# Patient Record
Sex: Female | Born: 1975 | Race: White | Hispanic: No | Marital: Married | Smoking: Former smoker
Health system: Southern US, Community
[De-identification: ages and names within clinical notes are randomized; demographics above are authoritative.]

## PROBLEM LIST (undated history)

## (undated) DIAGNOSIS — C73 Malignant neoplasm of thyroid gland: Secondary | ICD-10-CM

## (undated) HISTORY — PX: BREAST EXCISIONAL BIOPSY: SUR124

## (undated) HISTORY — PX: THYROID SURGERY: SHX805

---

## 1998-03-01 ENCOUNTER — Emergency Department (HOSPITAL_COMMUNITY): Admission: EM | Admit: 1998-03-01 | Discharge: 1998-03-02 | Payer: Self-pay | Admitting: Emergency Medicine

## 1998-03-02 ENCOUNTER — Encounter: Payer: Self-pay | Admitting: Emergency Medicine

## 1998-03-05 ENCOUNTER — Ambulatory Visit (HOSPITAL_COMMUNITY): Admission: RE | Admit: 1998-03-05 | Discharge: 1998-03-05 | Payer: Self-pay | Admitting: Family Medicine

## 1998-03-05 ENCOUNTER — Encounter: Payer: Self-pay | Admitting: Family Medicine

## 1998-09-01 ENCOUNTER — Ambulatory Visit (HOSPITAL_BASED_OUTPATIENT_CLINIC_OR_DEPARTMENT_OTHER): Admission: RE | Admit: 1998-09-01 | Discharge: 1998-09-01 | Payer: Self-pay | Admitting: Otolaryngology

## 1999-11-28 ENCOUNTER — Other Ambulatory Visit: Admission: RE | Admit: 1999-11-28 | Discharge: 1999-11-28 | Payer: Self-pay | Admitting: *Deleted

## 2000-07-25 ENCOUNTER — Emergency Department (HOSPITAL_COMMUNITY): Admission: EM | Admit: 2000-07-25 | Discharge: 2000-07-26 | Payer: Self-pay | Admitting: Emergency Medicine

## 2001-07-11 ENCOUNTER — Ambulatory Visit (HOSPITAL_COMMUNITY): Admission: RE | Admit: 2001-07-11 | Discharge: 2001-07-11 | Payer: Self-pay | Admitting: General Surgery

## 2001-07-11 ENCOUNTER — Encounter (INDEPENDENT_AMBULATORY_CARE_PROVIDER_SITE_OTHER): Payer: Self-pay | Admitting: *Deleted

## 2001-08-27 ENCOUNTER — Other Ambulatory Visit: Admission: RE | Admit: 2001-08-27 | Discharge: 2001-08-27 | Payer: Self-pay | Admitting: Obstetrics and Gynecology

## 2001-12-25 ENCOUNTER — Encounter: Payer: Self-pay | Admitting: General Surgery

## 2001-12-25 ENCOUNTER — Encounter: Admission: RE | Admit: 2001-12-25 | Discharge: 2001-12-25 | Payer: Self-pay | Admitting: General Surgery

## 2001-12-25 ENCOUNTER — Encounter (INDEPENDENT_AMBULATORY_CARE_PROVIDER_SITE_OTHER): Payer: Self-pay | Admitting: Specialist

## 2002-01-14 ENCOUNTER — Ambulatory Visit (HOSPITAL_COMMUNITY): Admission: RE | Admit: 2002-01-14 | Discharge: 2002-01-14 | Payer: Self-pay | Admitting: General Surgery

## 2002-01-14 ENCOUNTER — Encounter (INDEPENDENT_AMBULATORY_CARE_PROVIDER_SITE_OTHER): Payer: Self-pay

## 2002-03-20 ENCOUNTER — Emergency Department (HOSPITAL_COMMUNITY): Admission: EM | Admit: 2002-03-20 | Discharge: 2002-03-20 | Payer: Self-pay | Admitting: Emergency Medicine

## 2002-09-11 ENCOUNTER — Other Ambulatory Visit: Admission: RE | Admit: 2002-09-11 | Discharge: 2002-09-11 | Payer: Self-pay | Admitting: Obstetrics and Gynecology

## 2003-09-22 ENCOUNTER — Encounter: Admission: RE | Admit: 2003-09-22 | Discharge: 2003-09-22 | Payer: Self-pay | Admitting: General Surgery

## 2006-01-28 ENCOUNTER — Emergency Department (HOSPITAL_COMMUNITY): Admission: EM | Admit: 2006-01-28 | Discharge: 2006-01-28 | Payer: Self-pay | Admitting: Emergency Medicine

## 2006-02-05 ENCOUNTER — Ambulatory Visit (HOSPITAL_COMMUNITY): Admission: RE | Admit: 2006-02-05 | Discharge: 2006-02-05 | Payer: Self-pay | Admitting: Urology

## 2006-03-02 ENCOUNTER — Ambulatory Visit (HOSPITAL_COMMUNITY): Admission: RE | Admit: 2006-03-02 | Discharge: 2006-03-02 | Payer: Self-pay | Admitting: Urology

## 2006-04-20 ENCOUNTER — Ambulatory Visit: Payer: Self-pay | Admitting: Critical Care Medicine

## 2007-11-12 ENCOUNTER — Encounter: Admission: RE | Admit: 2007-11-12 | Discharge: 2007-11-12 | Payer: Self-pay | Admitting: Obstetrics and Gynecology

## 2007-11-18 ENCOUNTER — Encounter: Admission: RE | Admit: 2007-11-18 | Discharge: 2007-11-18 | Payer: Self-pay | Admitting: Internal Medicine

## 2007-11-28 ENCOUNTER — Encounter (INDEPENDENT_AMBULATORY_CARE_PROVIDER_SITE_OTHER): Payer: Self-pay | Admitting: Interventional Radiology

## 2007-11-28 ENCOUNTER — Other Ambulatory Visit: Admission: RE | Admit: 2007-11-28 | Discharge: 2007-11-28 | Payer: Self-pay | Admitting: Interventional Radiology

## 2007-11-28 ENCOUNTER — Encounter: Admission: RE | Admit: 2007-11-28 | Discharge: 2007-11-28 | Payer: Self-pay | Admitting: Internal Medicine

## 2007-12-10 ENCOUNTER — Ambulatory Visit (HOSPITAL_COMMUNITY): Admission: RE | Admit: 2007-12-10 | Discharge: 2007-12-10 | Payer: Self-pay | Admitting: Internal Medicine

## 2008-01-24 ENCOUNTER — Encounter (INDEPENDENT_AMBULATORY_CARE_PROVIDER_SITE_OTHER): Payer: Self-pay | Admitting: Surgery

## 2008-01-24 ENCOUNTER — Ambulatory Visit (HOSPITAL_COMMUNITY): Admission: RE | Admit: 2008-01-24 | Discharge: 2008-01-25 | Payer: Self-pay | Admitting: Surgery

## 2008-06-02 ENCOUNTER — Encounter (HOSPITAL_COMMUNITY): Admission: RE | Admit: 2008-06-02 | Discharge: 2008-08-31 | Payer: Self-pay | Admitting: Internal Medicine

## 2008-06-04 ENCOUNTER — Encounter (HOSPITAL_COMMUNITY): Admission: RE | Admit: 2008-06-04 | Discharge: 2008-09-02 | Payer: Self-pay | Admitting: Internal Medicine

## 2008-10-21 ENCOUNTER — Encounter: Admission: RE | Admit: 2008-10-21 | Discharge: 2008-10-21 | Payer: Self-pay | Admitting: Internal Medicine

## 2008-11-05 ENCOUNTER — Encounter (INDEPENDENT_AMBULATORY_CARE_PROVIDER_SITE_OTHER): Payer: Self-pay | Admitting: Interventional Radiology

## 2008-11-05 ENCOUNTER — Ambulatory Visit (HOSPITAL_COMMUNITY): Admission: RE | Admit: 2008-11-05 | Discharge: 2008-11-05 | Payer: Self-pay | Admitting: Internal Medicine

## 2008-11-19 IMAGING — CR DG CHEST 2V
3 series · 3 of 3 positions shown · non-contrast
Comparison: Chest CT 03/02/2006.

CLINICAL DATA: 32-year-old female preoperative exam for
multinodular goiter.

CHEST - 2 VIEW

[w chest pa *]
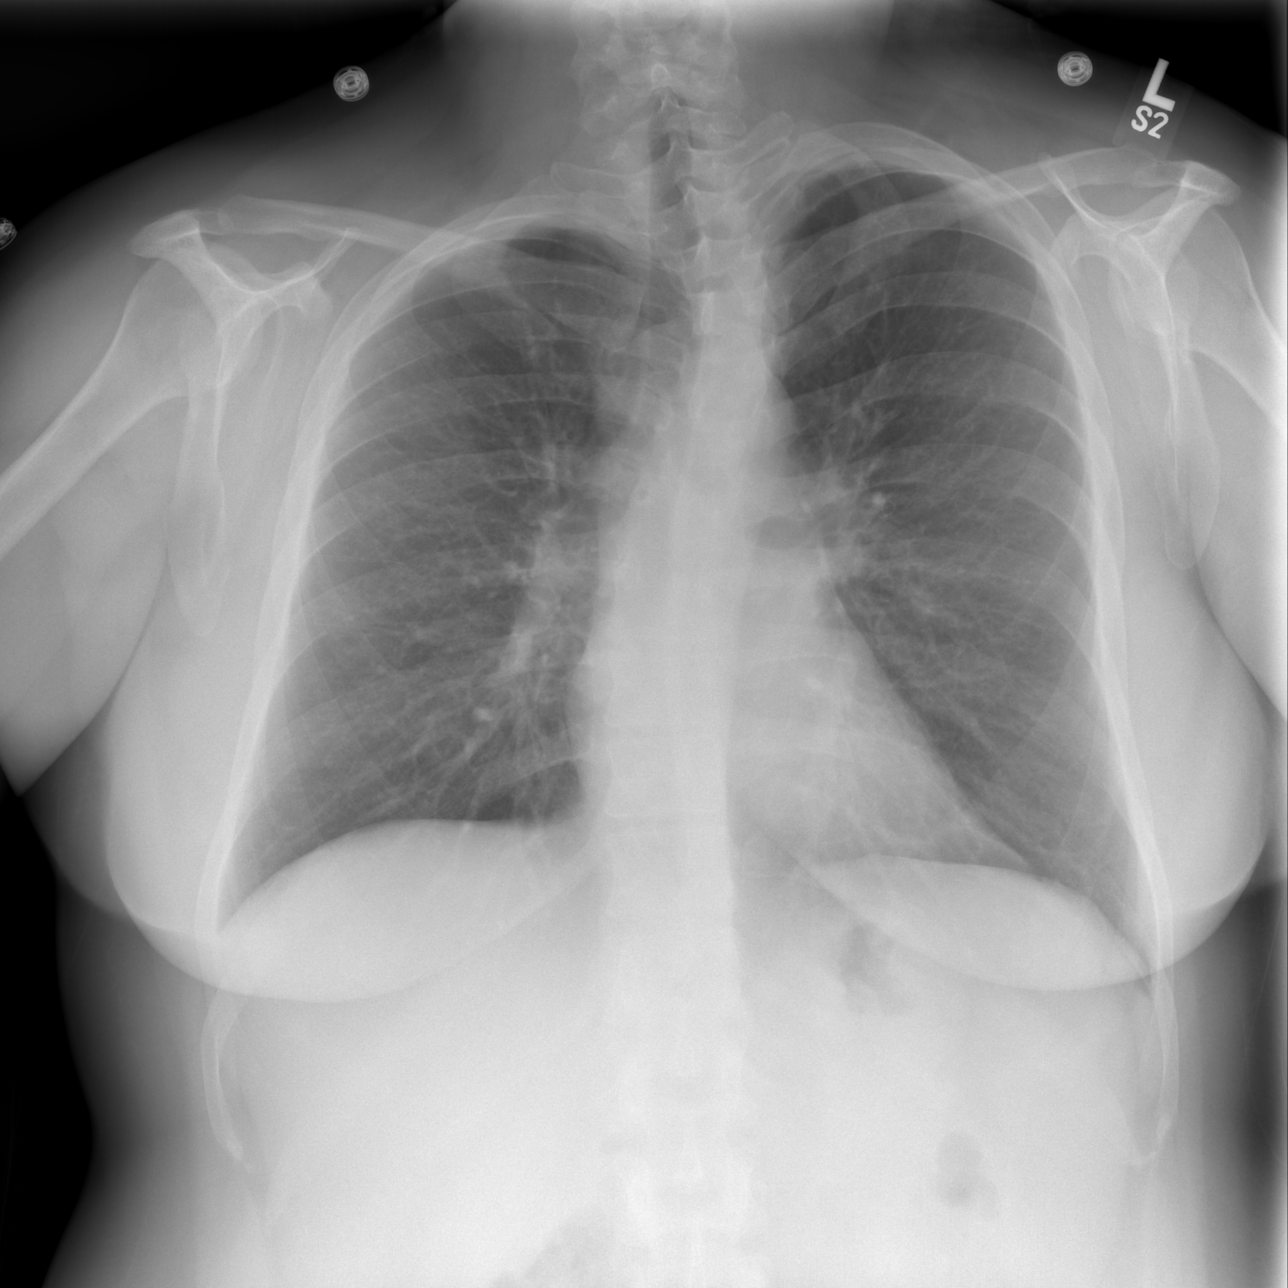

[w chest lat (1 of 2)]
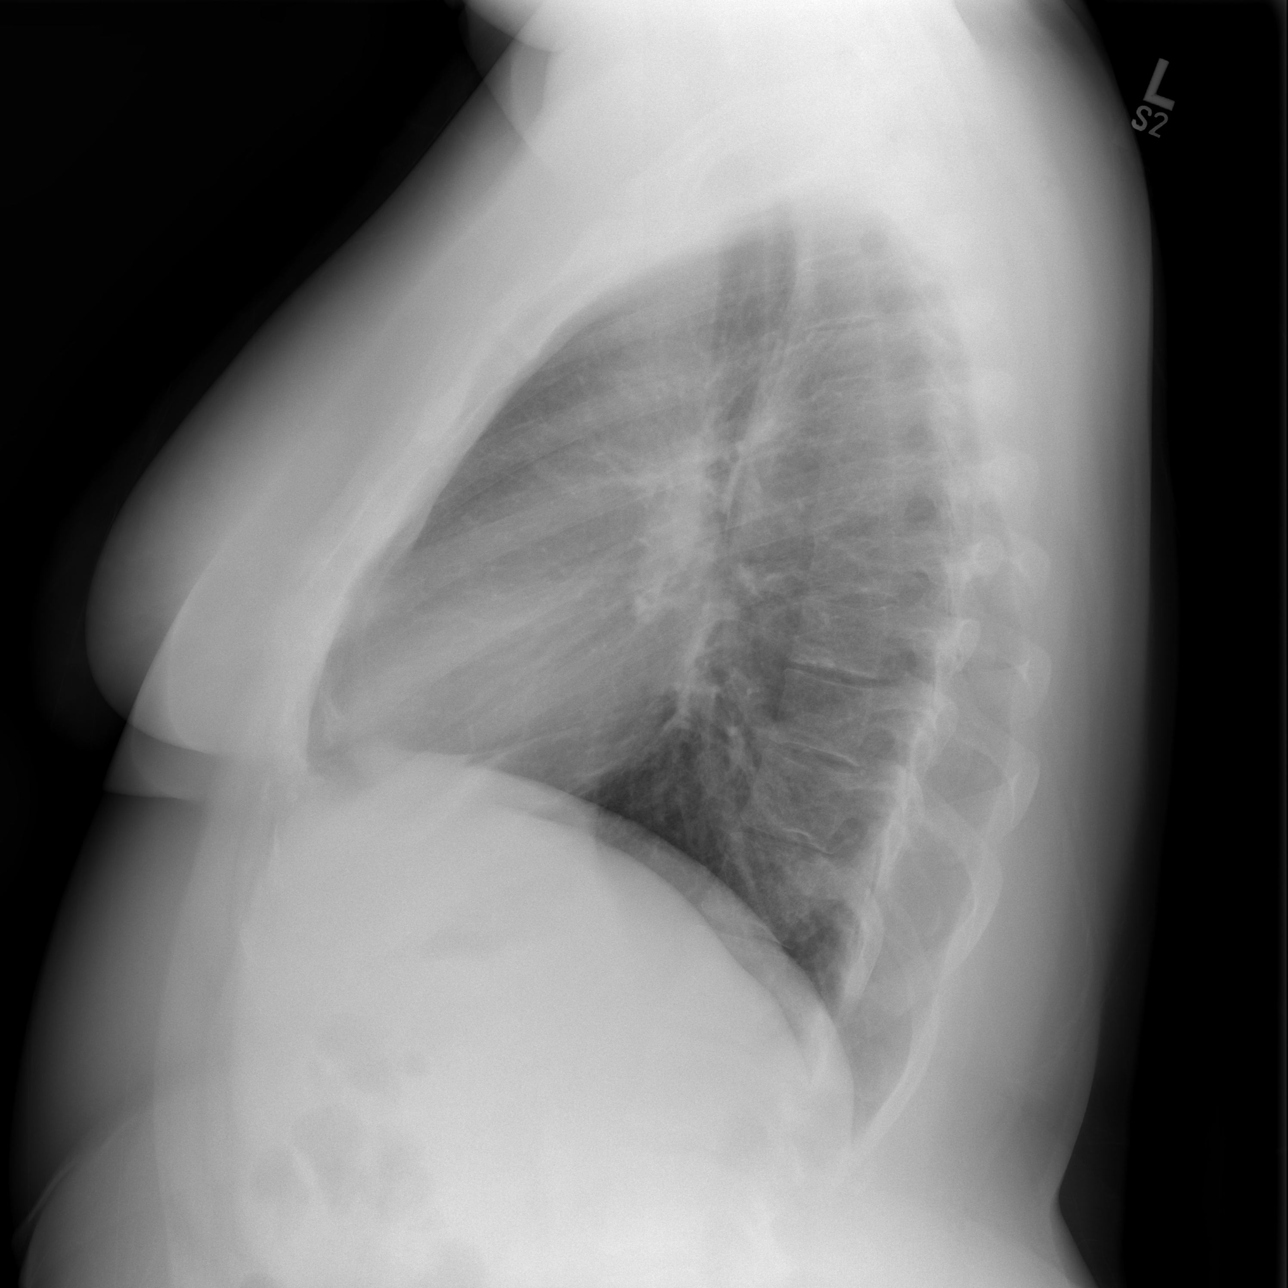

[w chest lat (2 of 2)]
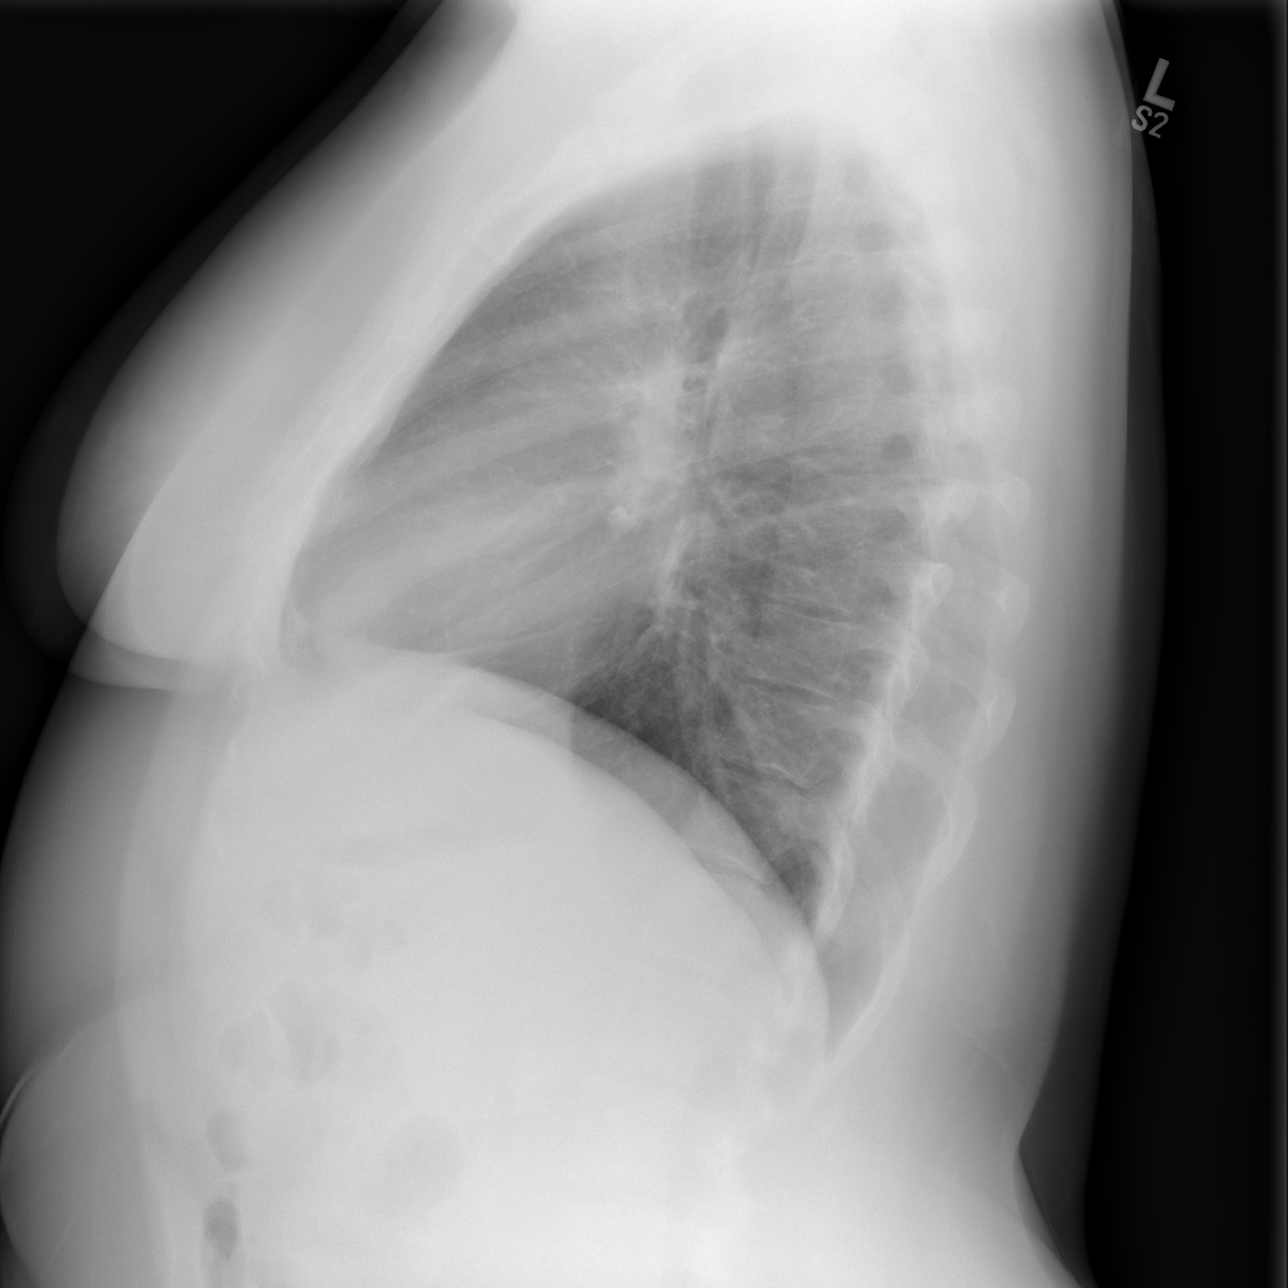

[3 of 3 positions shown; findings below may reference images not displayed]

FINDINGS: Stable upper thoracic levoconvex scoliosis. No acute
osseous abnormality identified.  Lung volumes within normal limits.
Normal cardiac size and mediastinal contours.  Tracheal air column
is within normal limits.  No tracheal deviation at the thoracic
inlet.  The lungs are clear.  No pleural effusion.  The small
pulmonary nodules described on the comparison CT would probably not
be visualized with plain radiographic technique.
IMPRESSION: No acute cardiopulmonary abnormality.

## 2008-11-20 ENCOUNTER — Encounter: Admission: RE | Admit: 2008-11-20 | Discharge: 2008-11-20 | Payer: Self-pay | Admitting: Obstetrics and Gynecology

## 2009-04-03 ENCOUNTER — Inpatient Hospital Stay (HOSPITAL_COMMUNITY): Admission: AD | Admit: 2009-04-03 | Discharge: 2009-04-03 | Payer: Self-pay | Admitting: Obstetrics and Gynecology

## 2009-04-04 ENCOUNTER — Ambulatory Visit: Payer: Self-pay | Admitting: Obstetrics and Gynecology

## 2009-04-04 ENCOUNTER — Inpatient Hospital Stay (HOSPITAL_COMMUNITY): Admission: AD | Admit: 2009-04-04 | Discharge: 2009-04-04 | Payer: Self-pay | Admitting: Obstetrics and Gynecology

## 2009-04-12 ENCOUNTER — Ambulatory Visit (HOSPITAL_COMMUNITY): Admission: RE | Admit: 2009-04-12 | Discharge: 2009-04-12 | Payer: Self-pay | Admitting: Internal Medicine

## 2009-04-14 ENCOUNTER — Encounter (HOSPITAL_COMMUNITY): Admission: RE | Admit: 2009-04-14 | Discharge: 2009-06-23 | Payer: Self-pay | Admitting: Internal Medicine

## 2009-09-01 IMAGING — US US BIOPSY
1 series · 5 of 5 positions shown · non-contrast
Comparison: none

CLINICAL DATA: Thyroid carcinoma status post thyroidectomy.
Recent ultrasound showed enlarged bilateral cervical lymph nodes.

[Series 1: us biopsy · 0.07mm/px · 5 of 5 slices shown]
[im 1/5]
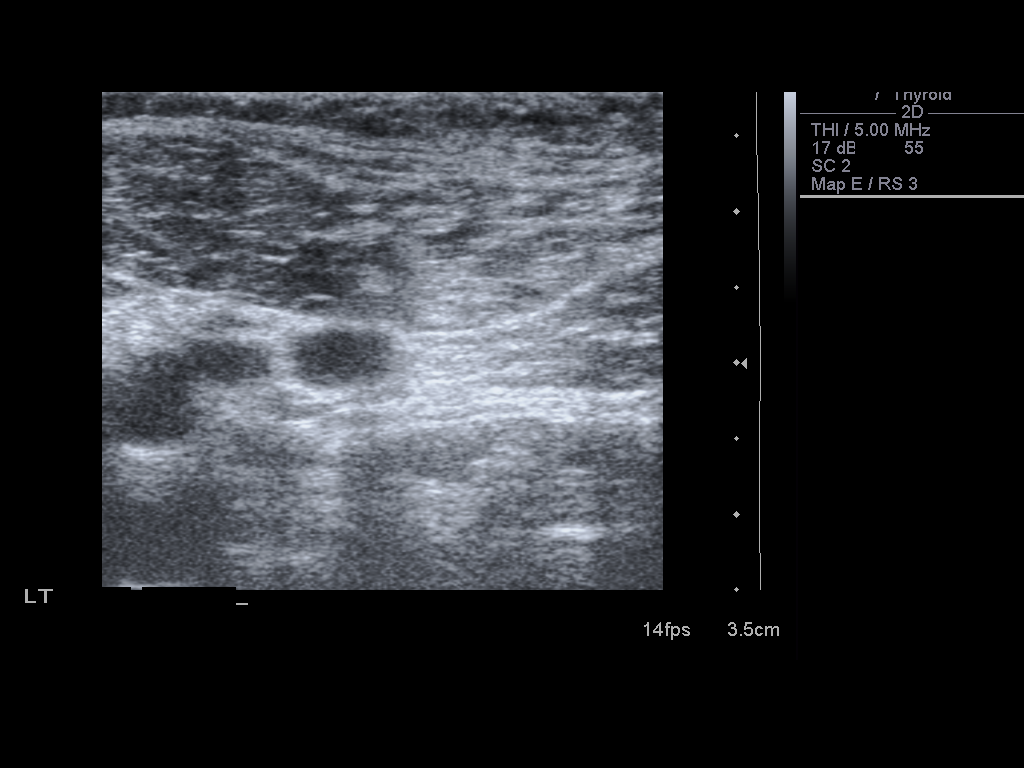
[im 2/5]
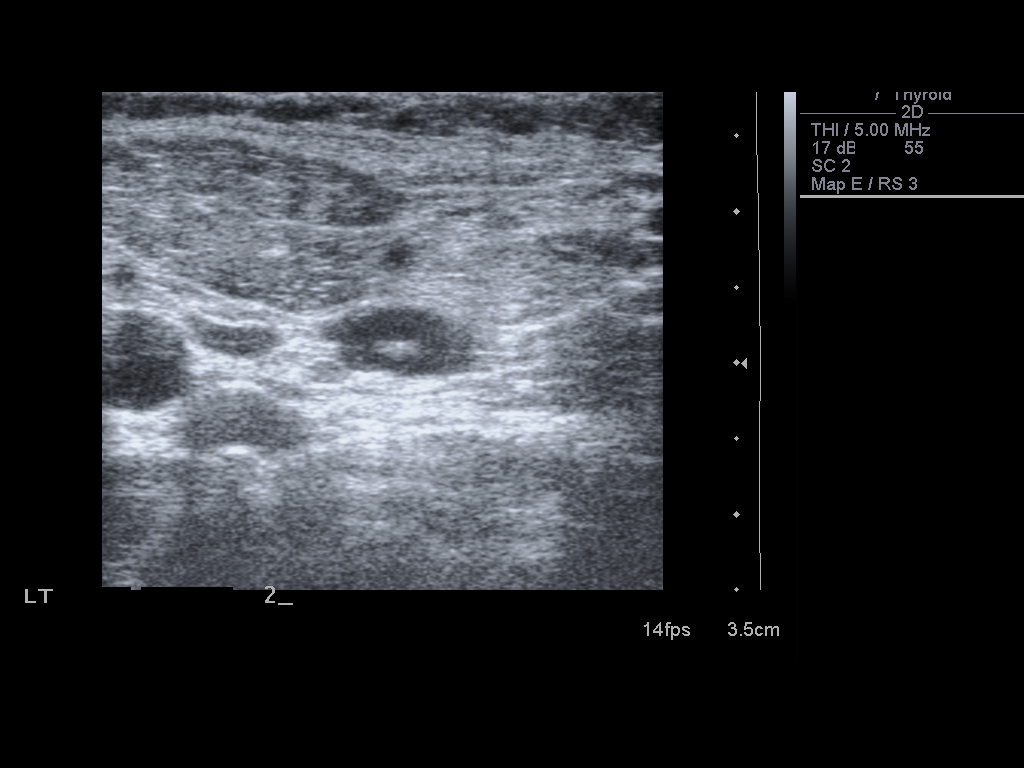
[im 3/5]
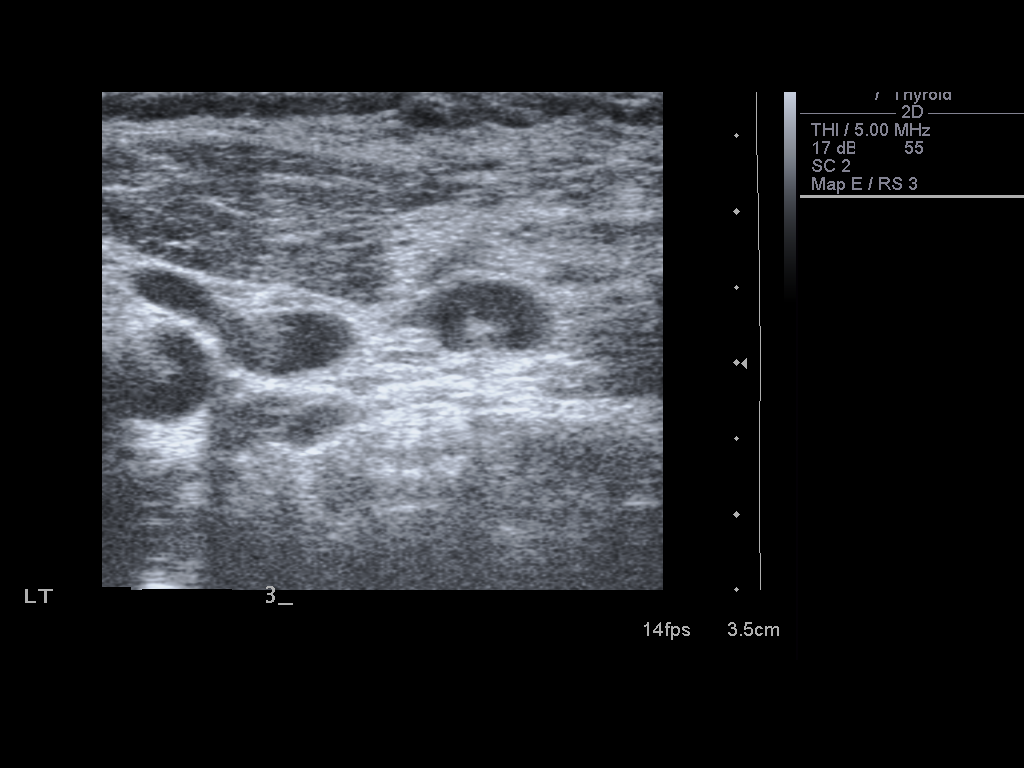
[im 4/5]
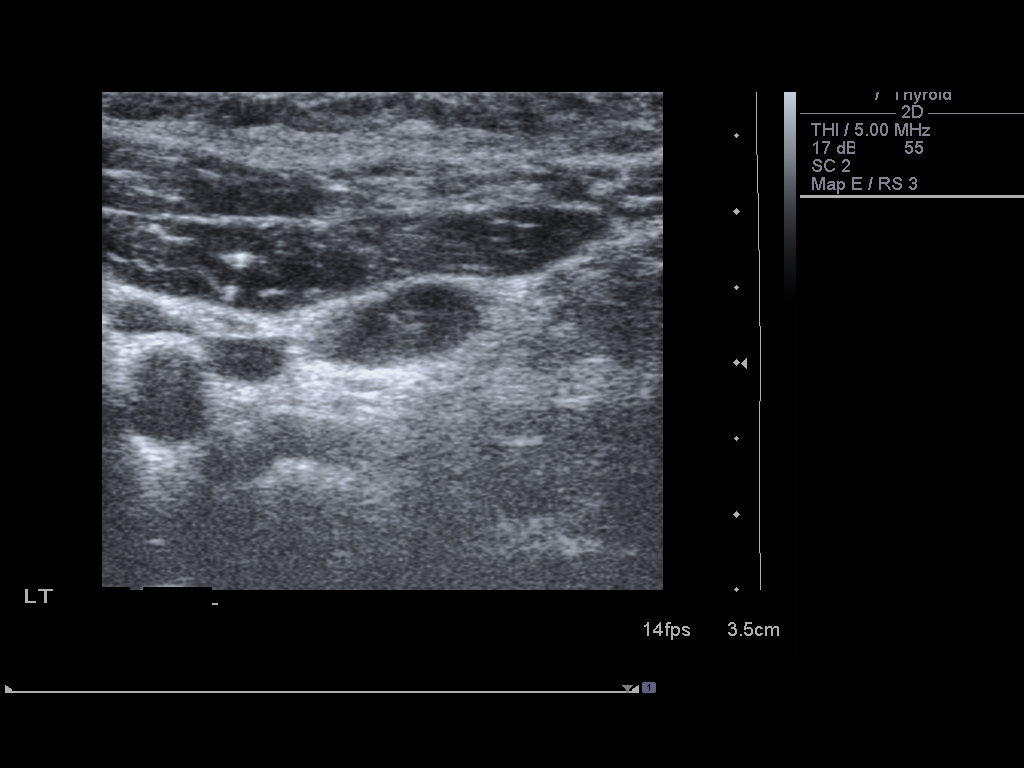
[im 5/5]
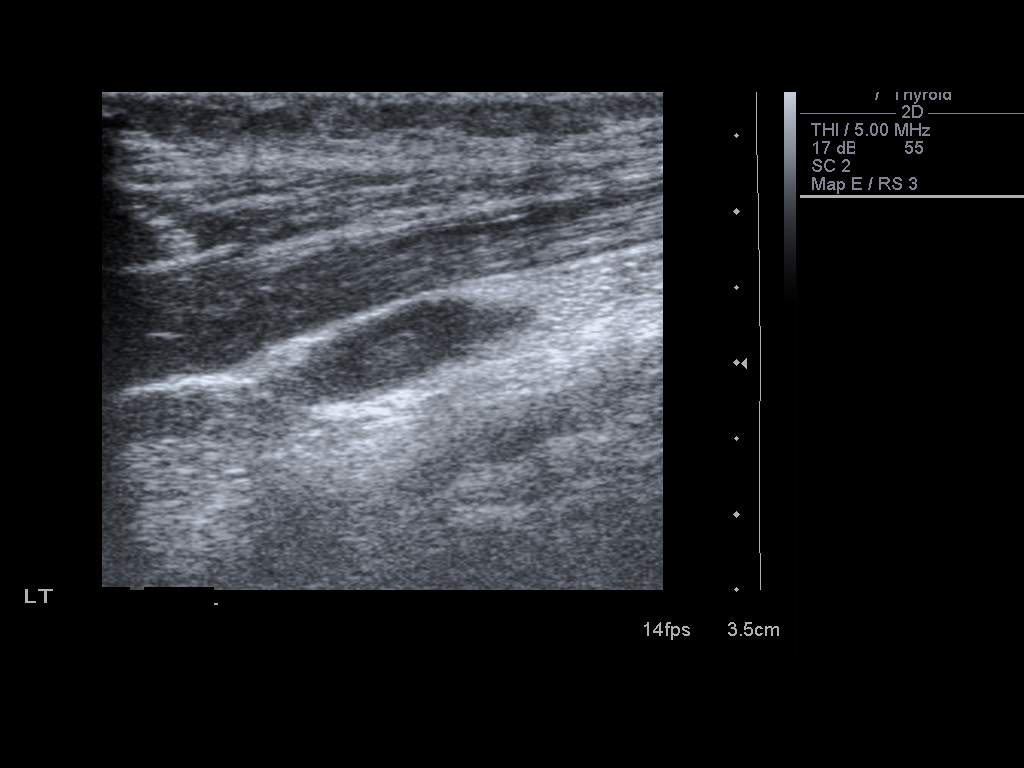

[5 of 5 positions shown; findings below may reference images not displayed]

ULTRASOUND-GUIDED CERVICAL NODE   ASPIRATION BIOPSY

Technique and findings:  Survey ultrasound of the neck was
performed and the enlarged   left cervical node lateral to the
internal jugular vein was localized.  An appropriate skin entry
site was determined.  Site was marked, prepped with Betadine,
draped in usual sterile fashion, infiltrated locally with 1%
lidocaine. Intravenous Fentanyl and Versed were administered as
conscious sedation during continuous cardiorespiratory monitoring
by the radiology RN, with a total moderate sedation time of 18
minutes.
Under real time ultrasound guidance, aspiration biopsy of the node
was performed using 20 and 22 gauge needles.  Samples were sent to
cytopathology. The patient tolerated the procedure well. No
immediate complication

IMPRESSION
1.  Technically successful ultrasound-guided aspiration biopsy of
dominant left cervical node.

## 2009-10-12 ENCOUNTER — Ambulatory Visit: Payer: Self-pay | Admitting: Advanced Practice Midwife

## 2009-10-12 ENCOUNTER — Inpatient Hospital Stay (HOSPITAL_COMMUNITY): Admission: AD | Admit: 2009-10-12 | Discharge: 2009-10-12 | Payer: Self-pay | Admitting: Obstetrics and Gynecology

## 2009-12-16 ENCOUNTER — Inpatient Hospital Stay (HOSPITAL_COMMUNITY): Admission: AD | Admit: 2009-12-16 | Discharge: 2009-12-19 | Payer: Self-pay | Admitting: Obstetrics and Gynecology

## 2010-02-23 ENCOUNTER — Encounter
Admission: RE | Admit: 2010-02-23 | Discharge: 2010-04-19 | Payer: Self-pay | Source: Home / Self Care | Attending: Obstetrics and Gynecology | Admitting: Obstetrics and Gynecology

## 2010-03-18 ENCOUNTER — Inpatient Hospital Stay (HOSPITAL_COMMUNITY)
Admission: AD | Admit: 2010-03-18 | Discharge: 2010-03-18 | Payer: Self-pay | Source: Home / Self Care | Attending: Obstetrics and Gynecology | Admitting: Obstetrics and Gynecology

## 2010-04-10 ENCOUNTER — Encounter: Payer: Self-pay | Admitting: Internal Medicine

## 2010-04-10 ENCOUNTER — Encounter: Payer: Self-pay | Admitting: Critical Care Medicine

## 2010-04-21 ENCOUNTER — Inpatient Hospital Stay (HOSPITAL_COMMUNITY)
Admission: AD | Admit: 2010-04-21 | Discharge: 2010-04-24 | DRG: 766 | Disposition: A | Discharge status: Home / Self Care | Payer: PRIVATE HEALTH INSURANCE | Source: Ambulatory Visit | Attending: Obstetrics and Gynecology | Admitting: Obstetrics and Gynecology

## 2010-04-21 DIAGNOSIS — O3660X0 Maternal care for excessive fetal growth, unspecified trimester, not applicable or unspecified: Secondary | ICD-10-CM | POA: Diagnosis present

## 2010-04-21 DIAGNOSIS — IMO0002 Reserved for concepts with insufficient information to code with codable children: Principal | ICD-10-CM | POA: Diagnosis present

## 2010-04-21 DIAGNOSIS — O99814 Abnormal glucose complicating childbirth: Secondary | ICD-10-CM | POA: Diagnosis present

## 2010-04-21 LAB — URIC ACID: Uric Acid, Serum: 4.2 mg/dL (ref 2.4–7.0)

## 2010-04-21 LAB — COMPREHENSIVE METABOLIC PANEL
Albumin: 2.7 g/dL — ABNORMAL LOW (ref 3.5–5.2)
BUN: 4 mg/dL — ABNORMAL LOW (ref 6–23)
CO2: 20 mEq/L (ref 19–32)
Calcium: 7.8 mg/dL — ABNORMAL LOW (ref 8.4–10.5)
Creatinine, Ser: 0.44 mg/dL (ref 0.4–1.2)
Potassium: 3.2 mEq/L — ABNORMAL LOW (ref 3.5–5.1)
Sodium: 137 mEq/L (ref 135–145)
Total Bilirubin: 0.5 mg/dL (ref 0.3–1.2)
Total Protein: 6.4 g/dL (ref 6.0–8.3)

## 2010-04-21 LAB — URINALYSIS, ROUTINE W REFLEX MICROSCOPIC
Ketones, ur: NEGATIVE mg/dL
Leukocytes, UA: NEGATIVE
Nitrite: NEGATIVE
pH: 6.5 (ref 5.0–8.0)

## 2010-04-21 LAB — CBC: MCV: 78.7 fL (ref 78.0–100.0)

## 2010-04-21 LAB — URINE MICROSCOPIC-ADD ON

## 2010-04-22 LAB — CBC
HCT: 25.6 % — ABNORMAL LOW (ref 36.0–46.0)
MCV: 79.5 fL (ref 78.0–100.0)
RDW: 14.4 % (ref 11.5–15.5)
WBC: 8.4 10*3/uL (ref 4.0–10.5)

## 2010-04-22 LAB — COMPREHENSIVE METABOLIC PANEL
Alkaline Phosphatase: 131 U/L — ABNORMAL HIGH (ref 39–117)
BUN: 5 mg/dL — ABNORMAL LOW (ref 6–23)
Chloride: 104 mEq/L (ref 96–112)
GFR calc non Af Amer: >60 mL/min (ref >60)
Glucose, Bld: 133 mg/dL — ABNORMAL HIGH (ref 70–99)
Potassium: 3.4 mEq/L — ABNORMAL LOW (ref 3.5–5.1)
Total Bilirubin: 0.4 mg/dL (ref 0.3–1.2)
Total Protein: 5.1 g/dL — ABNORMAL LOW (ref 6.0–8.3)

## 2010-04-22 LAB — MAGNESIUM: Magnesium: 4.4 mg/dL — ABNORMAL HIGH (ref 1.5–2.5)

## 2010-04-22 LAB — LACTATE DEHYDROGENASE: LDH: 197 U/L (ref 94–250)

## 2010-04-23 LAB — CBC
HCT: 24.7 % — ABNORMAL LOW (ref 36.0–46.0)
MCV: 79.9 fL (ref 78.0–100.0)
RDW: 14.8 % (ref 11.5–15.5)

## 2010-04-24 LAB — COMPREHENSIVE METABOLIC PANEL
AST: 25 U/L (ref 0–37)
CO2: 26 mEq/L (ref 19–32)
Calcium: 8 mg/dL — ABNORMAL LOW (ref 8.4–10.5)
Creatinine, Ser: 0.47 mg/dL (ref 0.4–1.2)
GFR calc Af Amer: >60 mL/min (ref >60)
GFR calc non Af Amer: >60 mL/min (ref >60)
Total Protein: 4.8 g/dL — ABNORMAL LOW (ref 6.0–8.3)

## 2010-04-24 LAB — CBC
Hemoglobin: 7.5 g/dL — ABNORMAL LOW (ref 12.0–15.0)
MCH: 25.2 pg — ABNORMAL LOW (ref 26.0–34.0)
MCHC: 31.1 g/dL (ref 30.0–36.0)
RDW: 14.8 % (ref 11.5–15.5)

## 2010-04-24 LAB — URIC ACID: Uric Acid, Serum: 5.2 mg/dL (ref 2.4–7.0)

## 2010-04-25 LAB — RH IMMUNE GLOB WKUP(>/=20WKS)(NOT WOMEN'S HOSP)
Fetal Screen: NEGATIVE
Unit division: 0

## 2010-04-27 ENCOUNTER — Inpatient Hospital Stay (HOSPITAL_COMMUNITY)
Admission: AD | Admit: 2010-04-27 | Discharge: 2010-04-27 | Disposition: A | Discharge status: Home / Self Care | Payer: PRIVATE HEALTH INSURANCE | Source: Ambulatory Visit | Attending: Obstetrics and Gynecology | Admitting: Obstetrics and Gynecology

## 2010-04-27 DIAGNOSIS — O900 Disruption of cesarean delivery wound: Secondary | ICD-10-CM | POA: Insufficient documentation

## 2010-05-11 NOTE — Op Note (Signed)
  NAMECYNDEE, Jacqueline Calderon             ACCOUNT NO.:  [id number]  MEDICAL RECORD NO.:  [id number]           PATIENT TYPE:  I  LOCATION:  9373                          [facility]:  [facility]  PHYSICIAN:  Arnette Driggs L. Ardis Fullwood, M.D.DATE OF BIRTH:  26-Dec-1975  DATE OF PROCEDURE:  04/21/2010 DATE OF DISCHARGE:                              OPERATIVE REPORT   PREOPERATIVE DIAGNOSES:  Intrauterine pregnancy at 37 weeks and 4 days, chronic hypertension with possible superimposed pregnancy-induced hypertension, gestational diabetes, and macrosomia.  POSTOPERATIVE DIAGNOSES:  Intrauterine pregnancy at 37 weeks and 4 days, chronic hypertension with possible superimposed pregnancy-induced hypertension, gestational diabetes, and macrosomia.  PROCEDURE:  Primary low transverse cesarean section.  SURGEON:  Railee Bonillas L. Vincente Poli, M.D.  ANESTHESIA:  Spinal.  FINDINGS:  Female infant, cephalic presentation Apgars 8 at 1 minute and 9 at 5 minutes.  Weight is 9 pounds.  PATHOLOGY:  None.  ESTIMATED BLOOD LOSS:  500 mL.  COMPLICATIONS:  None.  PROCEDURE:  The patient was taken to the operating room.  Her spinal was placed.  She was then prepped and draped.  A Foley catheter was inserted.  A low transverse incision was made and carried down to the fascia.  Fascia was scored in the midline and extended laterally. Rectus muscles were separated in the midline.  The peritoneum was entered bluntly.  The peritoneal incision was then stretched.  The bladder blade was inserted.  The lower uterine segment was identified and the bladder flap was created sharply and then digitally.  The bladder blade was readjusted.  A low transverse incision was made in the uterus.  Uterus was entered using a hemostat.  The baby was in cephalic presentation and was delivered easily with 2 gentle pulls with the vacuum without a pop-off.  The baby was a female infant, weight 9 pounds and appeared to be large for gestational age.  The Apgars  were 8 at 1 minute and 9 at 5 minutes.  There was a loose body cord.  The cord was clamped and cut. The baby was handed to the awaiting neonatal team and taken to the newborn nursery.  The uterus was exteriorized.  It was cleared of all clots and debris after the placenta was manually removed noted to be normal intact with a three-vessel cord.  Antibiotics and Pitocin were given.  The uterine incision was closed in 2 layers using 0 chromic in a running locked stitch.  Hemostasis was excellent.  The adnexa were unremarkable uterus was returned to the abdomen.  Irrigation was performed. Hemostasis was excellent.  The peritoneum was closed using 0 Vicryl. The fascia was closed using 0 Vicryl starting at each corner and meeting in the midline after irrigation of subcutaneous layer, the skin was closed with staples.  All sponge, lap, and instrument counts were correct x2.  The patient went to recovery room in stable condition.     Stockton Nunley L. Vincente Poli, M.D.     Florestine Avers  D:  04/21/2010  T:  04/22/2010  Job:  [id number]  Electronically Signed by Marcelle Overlie M.D. on 05/11/2010 07:55:22 AM

## 2010-05-11 NOTE — Discharge Summary (Signed)
  NAMEMADASON, Jacqueline Calderon             ACCOUNT NO.:  [id number]  MEDICAL RECORD NO.:  [id number]           PATIENT TYPE:  I  LOCATION:  9130                          [facility]ACILITY:  [facility]  PHYSICIAN:  Juluis Mire, M.D.   DATE OF BIRTH:  1976-02-26  DATE OF ADMISSION:  04/21/2010 DATE OF DISCHARGE:  04/24/2010                              DISCHARGE SUMMARY   ADMITTING DIAGNOSES:  Intrauterine pregnancy at 11 and 4/7, chronic hypertension with superimposed preeclampsia, gestational diabetes, macrosomia.  DISCHARGE DIAGNOSES:  Intrauterine pregnancy at 62 and 4/7, chronic hypertension with superimposed preeclampsia, gestational diabetes, macrosomia.  OPERATIVE PROCEDURE:  Primary cesarean section.  For complete history and physical, please see written note.  COURSE IN THE HOSPITAL:  The patient came in and underwent primary cesarean section.  She was placed in the AICU and placed on magnesium sulfate.  Did have slightly elevated SGOT and SGPT.  She did well up in AICU and the magnesium was lowered due to lowering blood pressure.  She was subsequently taken off magnesium sulfate on the fourth and sent to regular Mother-Baby Unit.  Did have some elevation of blood pressures, so we increased labetalol to 200 b.i.d.  She did receive 1 dose of hydralazine, the next morning she was afebrile, blood pressure 152/85 and 143/80.  Her vital signs were stable.  Abdomen was soft.  Bowel sounds were active.  She had had several bowel movements.  Incision was clear.  Deep tendon reflexes were 2+ and no clonus.  Lochia was normal.  IN TERMS OF COMPLICATION:  None encountered during the stay in hospital. The patient discharged in stable condition.  DISPOSITION:  PIH warning signs are given.  She is also instructed to avoid heavy lifting, vaginal entry, or driving a car.  She is to watch for signs of infection, nausea, vomiting, active vaginal bleeding or increasing pain or discomfort.  Instructed  signs and symptoms of infection of breast, uterus, and incision.  She will follow up in the office to reassess blood pressure, staples will be removed here prior to discharge, Steri-Strips will be applied.  She is discharged home on labetalol 200 mg b.i.d., Tylox as needed for pain, prenatal vitamins, and we will continue her levothyroxine as noted.     Juluis Mire, M.D.    JSM/MEDQ  D:  04/24/2010  T:  04/25/2010  Job:  [id number]  Electronically Signed by Richardean Chimera M.D. on 05/11/2010 07:45:56 AM

## 2010-05-30 LAB — CBC
Hemoglobin: 10.7 g/dL — ABNORMAL LOW (ref 12.0–15.0)
MCV: 81.8 fL (ref 78.0–100.0)
Platelets: 193 10*3/uL (ref 150–400)
RBC: 4.02 MIL/uL (ref 3.87–5.11)
WBC: 7.3 10*3/uL (ref 4.0–10.5)

## 2010-05-30 LAB — COMPREHENSIVE METABOLIC PANEL
AST: 26 U/L (ref 0–37)
Albumin: 2.8 g/dL — ABNORMAL LOW (ref 3.5–5.2)
Alkaline Phosphatase: 113 U/L (ref 39–117)
CO2: 24 mEq/L (ref 19–32)
Chloride: 108 mEq/L (ref 96–112)
GFR calc Af Amer: >60 mL/min (ref >60)
Potassium: 3.3 mEq/L — ABNORMAL LOW (ref 3.5–5.1)
Total Bilirubin: 0.4 mg/dL (ref 0.3–1.2)

## 2010-05-30 LAB — URINALYSIS, ROUTINE W REFLEX MICROSCOPIC
Bilirubin Urine: NEGATIVE
Glucose, UA: NEGATIVE mg/dL
Ketones, ur: NEGATIVE mg/dL
Protein, ur: NEGATIVE mg/dL

## 2010-05-30 LAB — URINE MICROSCOPIC-ADD ON

## 2010-06-02 LAB — CBC
HCT: 35 % — ABNORMAL LOW (ref 36.0–46.0)
Hemoglobin: 12.2 g/dL (ref 12.0–15.0)
MCH: 30.3 pg (ref 26.0–34.0)
MCV: 86.7 fL (ref 78.0–100.0)
RBC: 4.04 MIL/uL (ref 3.87–5.11)

## 2010-06-02 LAB — COMPREHENSIVE METABOLIC PANEL
AST: 19 U/L (ref 0–37)
CO2: 26 mEq/L (ref 19–32)
Chloride: 105 mEq/L (ref 96–112)
Creatinine, Ser: 0.68 mg/dL (ref 0.4–1.2)
GFR calc Af Amer: >60 mL/min (ref >60)
GFR calc non Af Amer: >60 mL/min (ref >60)
Total Bilirubin: 0.3 mg/dL (ref 0.3–1.2)

## 2010-06-02 LAB — URINE CULTURE: Culture  Setup Time: 201109300303

## 2010-06-05 LAB — URINALYSIS, ROUTINE W REFLEX MICROSCOPIC
Bilirubin Urine: NEGATIVE
Glucose, UA: NEGATIVE mg/dL
Ketones, ur: NEGATIVE mg/dL
pH: 6.5 (ref 5.0–8.0)

## 2010-06-05 LAB — URINE CULTURE: Colony Count: NO GROWTH

## 2010-06-05 LAB — CBC
HCT: 40.2 % (ref 36.0–46.0)
MCV: 85.2 fL (ref 78.0–100.0)
Platelets: 265 10*3/uL (ref 150–400)
RBC: 4.72 MIL/uL (ref 3.87–5.11)
WBC: 9.6 10*3/uL (ref 4.0–10.5)

## 2010-06-05 LAB — GC/CHLAMYDIA PROBE AMP, GENITAL
Chlamydia, DNA Probe: NEGATIVE
GC Probe Amp, Genital: NEGATIVE

## 2010-06-05 LAB — HCG, SERUM, QUALITATIVE: Preg, Serum: NEGATIVE

## 2010-06-05 LAB — POCT PREGNANCY, URINE: Preg Test, Ur: NEGATIVE

## 2010-06-05 LAB — URINE MICROSCOPIC-ADD ON

## 2010-06-25 LAB — PROTIME-INR
INR: 0.9 (ref 0.00–1.49)
Prothrombin Time: 11.9 seconds (ref 11.6–15.2)

## 2010-06-25 LAB — CBC
Platelets: 268 10*3/uL (ref 150–400)
RDW: 13.5 % (ref 11.5–15.5)
WBC: 6.7 10*3/uL (ref 4.0–10.5)

## 2010-06-25 LAB — APTT: aPTT: 28 seconds (ref 24–37)

## 2010-06-30 LAB — HCG, SERUM, QUALITATIVE: Preg, Serum: NEGATIVE

## 2010-08-02 NOTE — Op Note (Signed)
NAME:  Jacqueline Calderon, Jacqueline Calderon            ACCOUNT NO.:  [id number]   MEDICAL RECORD NO.:  [id number]          PATIENT TYPE:  OIB   LOCATION:  0098                         FACILITY:  [facility]   PHYSICIAN:  Velora Heckler, MD      DATE OF BIRTH:  26-Mar-1975   DATE OF PROCEDURE:  01/24/2008  DATE OF DISCHARGE:                               OPERATIVE REPORT   PREOPERATIVE DIAGNOSIS:  Multinodular thyroid goiter with mild  compressive symptoms.   POSTOPERATIVE DIAGNOSIS:  Multinodular thyroid goiter with mild  compressive symptoms.   PROCEDURE:  Total thyroidectomy.   SURGEON:  Velora Heckler, MD, FACS   ASSISTANT:  Lennie Muckle, MD   ANESTHESIA:  General.   ESTIMATED BLOOD LOSS:  Minimal.   PREPARATION:  ChloraPrep.   COMPLICATIONS:  None.   INDICATIONS:  The patient is a 35 year old white female from [place],  [place].  On routine physical exam she was found to have a small  thyroid goiter.  Workup included thyroid ultrasound showing bilateral  sub centimeter thyroid nodules.  A complex nodule in the right superior  pole measured 1.5 cm in size.  Fine needle aspiration showed a  follicular lesion without significant atypia.  However, due to mild  compressive symptoms the patient presented for thyroidectomy.   PROCEDURE IN DETAIL:  The patient was done in OR #6 at the [facility].  The patient was brought to the operating room and  placed in supine position on the operating room table.  Following  administration of general anesthesia the patient is positioned and then  prepped and draped in the usual strict aseptic fashion.  After  ascertaining that an adequate level of anesthesia had been achieved, a  Kocher incision was made with a #15 blade.  Dissection was carried  through subcutaneous tissues and platysma.  Hemostasis was obtained with  the electrocautery.  Skin flaps were elevated cephalad and caudad from  the thyroid notch to the sternal notch.  A  Mahorner self-retaining  retractor was placed for exposure.  Strap muscles were incised in the  midline and reflected to the left.  Left lobe was exposed.  It was  gently dissected out.  Venous tributaries were divided between medium  Ligaclips with the Harmonic scalpel.  Using gentle blunt dissection the  lobe was completely mobilized.  Superior pole vessels were then ligated  in continuity with 2-0 silk ties and divided with the Harmonic scalpel.  Additional branches to the superior pole were divided between medium  Ligaclips with the Harmonic scalpel.  Parathyroid tissue was identified  superiorly and preserved on its vascular pedicle.  Inferior venous  tributaries were divided between medium Ligaclips with the Harmonic  scalpel.  Gland was rolled anteriorly.  Branches of the inferior thyroid  artery are divided between small and medium Ligaclips with the Harmonic  scalpel.  Recurrent nerve was identified and preserved.  Ligament of  Allyson Sabal was transected with electrocautery and the gland was rolled up and  onto the anterior trachea.  Isthmus was mobilized across the midline.  There was no significant pyramidal lobe.  Next we turned our attention to the right thyroid lobe.  Again strap  muscles were mobilized off the surface of the gland and reflected  laterally.  Middle thyroid vein was divided between Ligaclips with the  Harmonic scalpel.  Superior pole vessels were dissected out and divided  between medium Ligaclips with the Harmonic scalpel.  Gland was rolled  anteriorly.  Parathyroid tissue was identified and preserved on its  vascular pedicle.  Branches of the inferior thyroid artery are divided  between small and medium Ligaclips with the Harmonic scalpel.  Recurrent  nerve was identified and preserved.  Inferior venous tributaries were  divided between medium Ligaclips with the Harmonic scalpel.  Ligament of  Allyson Sabal was released with electrocautery and the gland was excised off  the  anterior trachea.  A suture was used to mark the right superior pole.  The entire thyroid was submitted to pathology for review.  Neck was  irrigated with warm saline.  Good hemostasis was noted.  Surgicel was  placed in the operative field bilaterally.  Strap muscles were  reapproximated midline with interrupted 3-0 Vicryl sutures.  Platysma  was closed with interrupted 3-0 Vicryl sutures.  Skin was closed with a  running 4-0 Monocryl subcuticular suture.  Wound was washed and dried  and Benzoin and Steri-Strips were applied.  Sterile dressings are  applied.  The patient was awakened from anesthesia and brought to the  recovery room in stable condition.  The patient tolerated the procedure  well.      Velora Heckler, MD  Electronically Signed     TMG/MEDQ  D:  01/24/2008  T:  01/24/2008  Job:  [id number]   cc:   Talmage Coin, M.D.   Julio Sicks, N.P.

## 2010-08-05 NOTE — Assessment & Plan Note (Signed)
[facility]                             PULMONARY OFFICE NOTE   NAME:Jacqueline Calderon, Jacqueline Calderon                   MRN:          [id number]  DATE:04/20/2006                            DOB:          1975-12-17    CHIEF COMPLAINT:  Evaluate lung nodules and cough.   HISTORY OF PRESENT ILLNESS:  This is a 35 year old white female who has  had a chronic cough now for 2 months. She had a CT scan done for renal  stone and they found nodules at the base of the lung. A more extensive  CT scan of the chest was performed and showed numerous very small non  calcified nodules. She is referred on this basis.  She does have ongoing  acid reflux symptoms with soreness in the throat. She has acid  indigestion at night in particular. She has gained 35 pounds over the  past year. She does smoke less than a pack a day for 7 years but quit  smoking a few days ago. She denies any shortness of breath or at rest.  She has a cough that is dry and nonproductive. There is no chest pain.  No irregular heart beats. There is no difficulty swallowing. No tooth or  dental problems. No headaches. There is minimal nasal congestion. No  hand or foot swelling. Symptoms have progressed and she is here for  evaluation.   PAST MEDICAL HISTORY:  Essentially negative and noncontributory. She did  have benign breast lump removed in 2003 and 2004.   ALLERGIES:  No known drug allergies.   CURRENT MEDICATIONS:  Yasmin birth control pill daily.   SOCIAL HISTORY:  Quit smoking as noted above. Works as a Multimedia programmer. Lives with her mother. Has no children. She is not married.   FAMILY HISTORY:  Positive for emphysema in a grandfather. Allergies in  mother. Heart disease in grandfather and father.   REVIEW OF SYSTEMS:  Otherwise noncontributory.   PHYSICAL EXAMINATION:  This is an obese white female in no distress.  VITAL SIGNS: Weight 202 pounds. Temperature 98, blood pressure  120/78,  pulse 100, saturation 97% on room air.  CHEST: Showed diminished breath sounds with no evidence of wheeze or  rhonchi.  CARDIAC EXAM: Showed regular rate and rhythm without S3, normal S1, S2.  ABDOMEN: Soft, nontender.  EXTREMITIES: Showed no edema, clubbing or venous disease.  SKIN: Was clear.  NEUROLOGIC EXAM: Intact.  HEENT EXAM: Showed no jugular venous distension or lymphadenopathy.  Oropharynx clear.  NECK: Supple.   CT scan of the chest was obtained and reviewed and revealed numerous  small, less than 5 mm nodules at the bases of the lung that are non  calcified.   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 She does have benign pulmonary nodules which are non calcified.   PLAN:  Recheck CT scan in 6 months. Begin BroveX 5 cc b.i.d. p.r.n. and  Zegerid 40 mg daily. Began a reflux diet. Will see the patient back in  6  months.     Charlcie Cradle Delford Field, MD, [facility]  Electronically Signed    PEW/MedQ  DD: 04/20/2006  DT: 04/20/2006  Job #: [id number]   cc:   attn: Dr. Mod[facility]

## 2010-08-05 NOTE — Op Note (Signed)
[facility]  Patient:    Jacqueline Calderon, Jacqueline Calderon Visit Number: [id number] MRN: [id number]          Service Type: DSU Location: DAY Attending Physician:  Carson Myrtle Dictated by:   Sheppard Plumber Earlene Plater, M.D. Proc. Date: 07/11/01 Admit Date:  07/11/2001                             Operative Report  PREOPERATIVE DIAGNOSIS:  Left breast mass.  POSTOPERATIVE DIAGNOSIS:  Left breast mass.  PROCEDURE:  Excision mass, left breast.  SURGEON:  Timothy E. Earlene Plater, M.D.  ANESTHESIA:  Local standby.  INDICATION:  Jacqueline Calderon is 69, otherwise healthy, has a small mass in the upper inner quadrant left breast and though ultrasound suggested a fibroid adenoma, because of its present and apparent growth, she is very anxious and wishes to proceed.  Carefully discussed with her the surgery, the scar, and the options for management.  In the preop area, she was evaluated by anesthesia, and she and I carefully localized the mass, and I marked the skin.  DESCRIPTION OF PROCEDURE:  She was taken to the operating room, placed supine, an IV started, sedation given.  The left breast was prepped and draped in the usual fashion.  The mass was located in the left upper inner quadrant approximately 8 cm from the areolar edge.  It was palpable.  The skin and subcutaneous tissue were injected with 0.25% Marcaine with epinephrine.  A short curvilinear incision made over the palpable mass.  The subcutaneous tissue dissected away, the mass identified in the breast tissue, grasped, and sharply dissected from the surrounding tissue.  It was submitted in formalin for pathology.  Cautery was carefully used, and all bleeding points were cauterized.  The wound was dry.  It was closed in layers with 4-0 Monocryl.  Steri-Strips applied.  Counts correct.  She tolerated it well and was removed to the recovery room in good condition.  Written and verbal instructions were given her and  her parents, and she will be followed as an outpatient. Dictated by:   Sheppard Plumber Earlene Plater, M.D. Attending Physician:  Carson Myrtle DD:  07/11/01 TD:  07/11/01 Job: [id number]

## 2010-08-05 NOTE — Op Note (Signed)
   NAME:  Jacqueline Calderon, Jacqueline Calderon                      ACCOUNT NO.:  [id number]   MEDICAL RECORD NO.:  [id number]                   PATIENT TYPE:  AMB   LOCATION:  DAY                                  FAC[facility]   PHYSICIAN:  Timothy E. Earlene Plater, M.D.              DATE OF BIRTH:  1975/12/13   DATE OF PROCEDURE:  01/14/2002  DATE OF DISCHARGE:                                 OPERATIVE REPORT   PREOPERATIVE DIAGNOSES:  Recurrent mass, left breast.   POSTOPERATIVE DIAGNOSES:  Recurrent mass, left breast.   PROCEDURE:  Excisional biopsy mass, left breast.   SURGEON:  Timothy E. Earlene Plater, M.D.   ANESTHESIA:  Local standby.   INDICATIONS FOR PROCEDURE:  Ms. [name] is 23, otherwise, healthy.  She developed a mass in the left breast approximately six months ago at  which time a biopsy was undertaken. Surprisingly this showed fibrocystic  changes with extensive atypical hyperplasia. She was carefully counselled,  presented at conference and we elected to follow this closely. She developed  a second mass that was in fact lateral to the previous mass and ultrasound  biopsy showed fibroadenoma. In spite of this, she wished to have the  complete mass removed and I agreed. She was carefully counselled and ready  for this procedure. She, her correct left breast was identified, marked and  permit signed.   DESCRIPTION OF PROCEDURE:  She was taken back to the operating room, placed  supine, IV sedation given with appropriate monitors. The left breast was  carefully prepped and draped in the usual fashion. I carefully marked the  previous biopsy, extended the incision more to the lateral. I then injected  the area with Marcaine 0.25% Marcaine with epinephrine and incised the old  scar, extended the incision laterally. The old scar tissue was palpable as  well in the mass. The entire area was completely removed as a generous  biopsy. Bleeding was controlled with the cautery and the wound was  dry. The  mass was carefully marked, one strand of suture medial, two strands lateral  and I spoke with Dr. Guilford Shi concerning the appropriateness of the specimen.  With the wound dry and counts correct, it was closed in layers with 3-0  Vicryl and 4-0 Monocryl. Steri-Strips applied. She tolerated it well and was  removed to the recovery room.   Written and verbal instructions were given to her and her mother and she  will be followed as an outpatient.                                                Timothy E. Earlene Plater, M.D.    TED/MEDQ  D:  01/14/2002  T:  01/14/2002  Job:  [id number]

## 2010-12-19 ENCOUNTER — Other Ambulatory Visit: Payer: Self-pay | Admitting: Internal Medicine

## 2010-12-19 DIAGNOSIS — C73 Malignant neoplasm of thyroid gland: Secondary | ICD-10-CM

## 2010-12-20 LAB — URINALYSIS, ROUTINE W REFLEX MICROSCOPIC
Hgb urine dipstick: NEGATIVE
Nitrite: NEGATIVE
Specific Gravity, Urine: 1.017
Urobilinogen, UA: 0.2

## 2010-12-20 LAB — COMPREHENSIVE METABOLIC PANEL
ALT: 39 — ABNORMAL HIGH
Albumin: 3.9
Calcium: 9.6
GFR calc Af Amer: >60
Glucose, Bld: 103 — ABNORMAL HIGH
Sodium: 139
Total Protein: 7.4

## 2010-12-20 LAB — CBC
Hemoglobin: 14.8
MCHC: 33.5
Platelets: 296
RDW: 13.8

## 2010-12-20 LAB — CALCIUM
Calcium: 8.3 — ABNORMAL LOW
Calcium: 8.5

## 2010-12-20 LAB — DIFFERENTIAL
Eosinophils Absolute: 0.3
Lymphs Abs: 1.4
Monocytes Absolute: 0.6
Monocytes Relative: 9
Neutrophils Relative %: 66

## 2010-12-20 LAB — PROTIME-INR: INR: 0.9

## 2010-12-20 LAB — PREGNANCY, URINE: Preg Test, Ur: NEGATIVE

## 2010-12-21 ENCOUNTER — Ambulatory Visit
Admission: RE | Admit: 2010-12-21 | Discharge: 2010-12-21 | Disposition: A | Discharge status: Home / Self Care | Payer: PRIVATE HEALTH INSURANCE | Source: Ambulatory Visit | Attending: Internal Medicine | Admitting: Internal Medicine

## 2010-12-21 DIAGNOSIS — C73 Malignant neoplasm of thyroid gland: Secondary | ICD-10-CM

## 2010-12-27 ENCOUNTER — Other Ambulatory Visit: Payer: PRIVATE HEALTH INSURANCE

## 2011-07-19 ENCOUNTER — Other Ambulatory Visit (HOSPITAL_COMMUNITY)
Admission: RE | Admit: 2011-07-19 | Discharge: 2011-07-19 | Disposition: A | Discharge status: Home / Self Care | Payer: PRIVATE HEALTH INSURANCE | Source: Ambulatory Visit | Attending: Family Medicine | Admitting: Family Medicine

## 2011-07-19 ENCOUNTER — Other Ambulatory Visit: Payer: Self-pay | Admitting: Family Medicine

## 2011-07-19 DIAGNOSIS — Z01419 Encounter for gynecological examination (general) (routine) without abnormal findings: Secondary | ICD-10-CM | POA: Insufficient documentation

## 2011-12-18 ENCOUNTER — Other Ambulatory Visit: Payer: Self-pay | Admitting: Internal Medicine

## 2011-12-18 DIAGNOSIS — C73 Malignant neoplasm of thyroid gland: Secondary | ICD-10-CM

## 2011-12-25 ENCOUNTER — Ambulatory Visit
Admission: RE | Admit: 2011-12-25 | Discharge: 2011-12-25 | Disposition: A | Discharge status: Home / Self Care | Payer: PRIVATE HEALTH INSURANCE | Source: Ambulatory Visit | Attending: Internal Medicine | Admitting: Internal Medicine

## 2011-12-25 DIAGNOSIS — C73 Malignant neoplasm of thyroid gland: Secondary | ICD-10-CM

## 2012-06-25 ENCOUNTER — Other Ambulatory Visit: Payer: Self-pay | Admitting: Obstetrics and Gynecology

## 2012-08-05 ENCOUNTER — Other Ambulatory Visit: Payer: Self-pay | Admitting: Endocrinology

## 2012-08-05 DIAGNOSIS — C73 Malignant neoplasm of thyroid gland: Secondary | ICD-10-CM

## 2012-09-02 ENCOUNTER — Encounter (HOSPITAL_COMMUNITY)
Admission: RE | Admit: 2012-09-02 | Discharge: 2012-09-02 | Disposition: A | Discharge status: Home / Self Care | Payer: BC Managed Care – PPO | Source: Ambulatory Visit | Attending: Endocrinology | Admitting: Endocrinology

## 2012-09-02 DIAGNOSIS — C73 Malignant neoplasm of thyroid gland: Secondary | ICD-10-CM | POA: Insufficient documentation

## 2012-09-02 MED ORDER — THYROTROPIN ALFA 1.1 MG IM SOLR
0.9000 mg | INTRAMUSCULAR | Status: DC
  Filled 2012-09-02: qty 0.9

## 2012-09-03 ENCOUNTER — Encounter (HOSPITAL_COMMUNITY): Payer: BC Managed Care – PPO

## 2012-09-03 ENCOUNTER — Encounter (HOSPITAL_COMMUNITY)
Admission: RE | Admit: 2012-09-03 | Discharge: 2012-09-03 | Disposition: A | Discharge status: Home / Self Care | Payer: BC Managed Care – PPO | Source: Ambulatory Visit | Attending: Endocrinology | Admitting: Endocrinology

## 2012-09-03 MED ORDER — THYROTROPIN ALFA 1.1 MG IM SOLR
0.9000 mg | INTRAMUSCULAR | Status: AC
  Administered 2012-09-03: 0.9 mg via INTRAMUSCULAR
  Filled 2012-09-03: qty 0.9

## 2012-09-04 ENCOUNTER — Ambulatory Visit (HOSPITAL_COMMUNITY)
Admission: RE | Admit: 2012-09-04 | Discharge: 2012-09-04 | Disposition: A | Discharge status: Home / Self Care | Payer: BC Managed Care – PPO | Source: Ambulatory Visit | Attending: Endocrinology | Admitting: Endocrinology

## 2012-09-04 ENCOUNTER — Encounter (HOSPITAL_COMMUNITY): Payer: BC Managed Care – PPO

## 2012-09-04 DIAGNOSIS — C73 Malignant neoplasm of thyroid gland: Secondary | ICD-10-CM | POA: Insufficient documentation

## 2012-09-04 LAB — HCG, SERUM, QUALITATIVE: Preg, Serum: NEGATIVE

## 2012-09-06 ENCOUNTER — Encounter (HOSPITAL_COMMUNITY): Payer: BC Managed Care – PPO

## 2012-09-06 ENCOUNTER — Encounter (HOSPITAL_COMMUNITY): Payer: Self-pay

## 2012-09-06 ENCOUNTER — Encounter (HOSPITAL_COMMUNITY)
Admission: RE | Admit: 2012-09-06 | Discharge: 2012-09-06 | Disposition: A | Discharge status: Home / Self Care | Payer: BC Managed Care – PPO | Source: Ambulatory Visit | Attending: Endocrinology | Admitting: Endocrinology

## 2012-09-06 DIAGNOSIS — C73 Malignant neoplasm of thyroid gland: Secondary | ICD-10-CM | POA: Insufficient documentation

## 2012-09-06 HISTORY — DX: Malignant neoplasm of thyroid gland: C73

## 2012-09-06 MED ORDER — SODIUM IODIDE I 131 CAPSULE
4.4000 | Freq: Once | INTRAVENOUS | Status: AC | PRN
  Administered 2012-09-06: 4.4 via ORAL

## 2013-06-30 ENCOUNTER — Other Ambulatory Visit: Payer: Self-pay | Admitting: Obstetrics and Gynecology

## 2014-07-03 ENCOUNTER — Other Ambulatory Visit: Payer: Self-pay | Admitting: Obstetrics and Gynecology

## 2014-07-06 LAB — CYTOLOGY - PAP

## 2016-07-06 ENCOUNTER — Other Ambulatory Visit: Payer: Self-pay | Admitting: Obstetrics and Gynecology

## 2016-07-06 DIAGNOSIS — N644 Mastodynia: Secondary | ICD-10-CM

## 2016-07-10 ENCOUNTER — Ambulatory Visit
Admission: RE | Admit: 2016-07-10 | Discharge: 2016-07-10 | Disposition: A | Discharge status: Home / Self Care | Payer: BLUE CROSS/BLUE SHIELD | Source: Ambulatory Visit | Attending: Obstetrics and Gynecology | Admitting: Obstetrics and Gynecology

## 2016-07-10 ENCOUNTER — Other Ambulatory Visit: Payer: Self-pay | Admitting: Obstetrics and Gynecology

## 2016-07-10 DIAGNOSIS — N644 Mastodynia: Secondary | ICD-10-CM

## 2016-07-11 ENCOUNTER — Ambulatory Visit
Admission: RE | Admit: 2016-07-11 | Discharge: 2016-07-11 | Disposition: A | Discharge status: Home / Self Care | Payer: BLUE CROSS/BLUE SHIELD | Source: Ambulatory Visit | Attending: Obstetrics and Gynecology | Admitting: Obstetrics and Gynecology

## 2016-07-11 ENCOUNTER — Other Ambulatory Visit: Payer: Self-pay | Admitting: Obstetrics and Gynecology

## 2016-07-11 DIAGNOSIS — N644 Mastodynia: Secondary | ICD-10-CM

## 2020-03-03 ENCOUNTER — Other Ambulatory Visit: Payer: Self-pay | Admitting: Obstetrics and Gynecology

## 2020-03-03 DIAGNOSIS — N6320 Unspecified lump in the left breast, unspecified quadrant: Secondary | ICD-10-CM

## 2022-05-30 ENCOUNTER — Other Ambulatory Visit: Payer: Self-pay | Admitting: Obstetrics and Gynecology

## 2022-05-30 DIAGNOSIS — N6310 Unspecified lump in the right breast, unspecified quadrant: Secondary | ICD-10-CM

## 2022-06-30 LAB — COLOGUARD: COLOGUARD: NEGATIVE

## 2022-07-06 ENCOUNTER — Ambulatory Visit: Payer: BC Managed Care – PPO | Admitting: Internal Medicine

## 2022-07-06 ENCOUNTER — Encounter: Payer: Self-pay | Admitting: Internal Medicine

## 2022-07-06 VITALS — BP 112/82 | HR 92 | Wt 235.2 lb

## 2022-07-06 DIAGNOSIS — C73 Malignant neoplasm of thyroid gland: Secondary | ICD-10-CM | POA: Insufficient documentation

## 2022-07-06 DIAGNOSIS — E890 Postprocedural hypothyroidism: Secondary | ICD-10-CM | POA: Diagnosis not present

## 2022-07-06 LAB — T4, FREE: Free T4: 0.61 ng/dL (ref 0.60–1.60)

## 2022-07-06 LAB — TSH: TSH: 1.8 u[IU]/mL (ref 0.35–5.50)

## 2022-07-06 LAB — T3, FREE: T3, Free: 3.8 pg/mL (ref 2.3–4.2)

## 2022-07-06 NOTE — Progress Notes (Addendum)
Patient ID: Jacqueline Calderon, female   DOB: [birth year], 47 y.o.   MRN: [id number]  HPI  Jacqueline Calderon is a 47 y.o.-year-old female, referred by Dr. 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 She previously saw Dr. Sharl Ma and then Dr. Talmage Nap.  Patient describes that she was found to have thyroid nodules during an exam in her OB/GYN office in 2009.  She was sent for a thyroid ultrasound, and then a biopsy, which confirmed thyroid cancer.  She had total thyroidectomy in 2009 and she mentions that she was started on a low-dose of levothyroxine afterwards.  She started to gain a lot of weight and felt poorly.  She saw [facility] and she was switched to Armour.  The dose was gradually increased over the years.  She describes that she has palpitations and tremors and overall signs of thyrotoxicosis at times, but sometimes she feels tired and has weight gain.  She saw endocrinology in the past, but not recently.  Refills were per by PCP, but she mentions that she had pauses in her Armour course at this was not refilled unless she went in person for an appointment.  She is now trying to establish care with another PCP.  Dr. Vincente Poli recently recommended that she establish care with me.  Reviewed thyroid cancer history: - dx in 2009.   Total thyroidectomy (01/24/2008-Dr. Gerkin)  THYROID, THYROIDECTOMY:   - PAPILLARY THYROID CARCINOMA, 0.5 CM.   - SEE ONCOLOGY TABLE BELOW.   1. Maximum tumor size (cm): 0.5 cm   2. Tumor location: Left upper lobe   3. Multifocality: No   4. Histology: Papillary thyroid carcinoma, follicular variant   5. Margins: Negative for carcinoma   6. Capsular invasion: N/A   7. Extrathyroidal extension: Not identified   8. Vascular/Lymphatic invasion: Not identified   9. Lymph nodes: # examined 0; # positive 0   10. TNM code: pT1a, pNX, pMX   11. Non-neoplastic thyroid: Adenomatous nodules (2)    12. Comments: The 0.5 cm nodule in the left upper   thyroid lobe has features of follicular variant of papillary   thyroid carcinoma. The remaining two grossly identifiable   nodules are adenomatous nodules.   RAI treatment (06/04/2008): 104.5 mCi  Whole-body scan (06/16/2018): 1.  Residual functioning thyroid tissue within the thyroid bed.  2.  No evidence for distant metastatic disease.   Thyroid ultrasound (10/21/2008): Findings: The right thyroid lobe, left thyroid lobe, and isthmus  are surgically absent status post total thyroidectomy.  Imaging of  the thyroid bed demonstrates a 0.9 x 0.7 x 0.5 cm lymph node at the  expected location of the isthmus.  Right superolateral lymph node measures 2.9 x 1.4 x 0.6 cm.  Left superolateral lymph node measures 2.9 x 1.3 x 0.9 cm.  These lateral lymph nodes as measured above appear to lack central  fatty hila, with cortical thickening.    Other smaller normal appearing lymph nodes are noted bilaterally.    IMPRESSION:  Bilateral abnormal appearing lymph nodes as above; differential  considerations include neoplastic versus infectious/inflammatory  involvement.  Consider ultrasound guided biopsy/aspiration for  further evaluation.   FNA  Left LN (11/05/2008):  SMEARS AND CELL BLOCK: ABUNDANT LYMPHOCYTES. NO METASTATIC   CARCINOMA IDENTIFIED.   Whole-body scan (04/08/2009): No areas of abnormal uptake  Whole-body scan (09/02/2012): No areas of abnormal uptake  Neck ultrasound (12/21/2010): Findings: Small irregular hyperechoic area in the soft tissues just  superior to the isthmus in the midline again visualized and stable  in appearance since the prior study.  This has an appearance  suggestive of scar tissue.   Several small lymph nodes are visualized in the left and right  neck.  Left cervical nodes are significantly less prominent  compared to the prior study with no dominant enlarged lymph node  remaining.  The largest lymph node in  the right neck is stable in  size and appearance, measuring roughly 1.6 x 0.5 x 1.2 cm.   IMPRESSION:  No evidence of enlarging lymph nodes or abnormal soft tissue  nodules.  Hyperechoic area in the midline is stable.  Lymph nodes  in the left neck are less prominent.  The largest lymph node in the  right neck is stable.   Neck ultrasound (12/25/2011): Lymphadenopathy: 1 right: 1.6 x 0.4 x 1 cm 2 left: 1.6 x 0.6 x 1 cm and 1.2 x 0.5 x 0.8 cm IMPRESSION:  1. Thyroidectomy without evidence of recurrent disease.  2.  Stable bilateral neck lymph nodes.    Neck ultrasound (08/10/2020): Ultrasound performed of the right posterior neck area of concern. In this region, there are superficial small adjacent hypoechoic nodules with fatty vascular hila measuring 6 x 4 x 7 mm and 8 x 6 x 6 mm. These are small superficial benign-appearing lymph nodes.  IMPRESSION: Right posterior neck palpable abnormality correlates with small superficial benign-appearing lymph nodes.   Pt denies: - feeling nodules in neck - hoarseness - dysphagia - choking She does have a lymph node in the right posterior neck which sometimes swells and causes her to have headaches.  Postsurgical hypothyroidism:  Pt is on Armour 240 mg daily (equivalent of 400 mcg LT4 daily!), taken: - in am - fasting - at least 30 min from b'fast - no calcium - no iron - no multivitamins - no PPIs - not on Biotin - on B12  No thyroid tests available for review: No results found for: "TSH", "FREET4"   Pt describes: - fatigue - weight gain - cold intolerance - constipation - dry skin - hair loss - depression  She has + FH of thyroid disorders in: maternal uncle. No FH of thyroid cancer.  No h/o radiation tx to head or neck aside RAI tx.  She has a FH of CHF in mother and M Grandfather and MGGM.  ROS: Constitutional: + Weight gain, + fatigue, + nocturia Eyes: no blurry vision, no xerophthalmia ENT: no sore throat, no  nodules felt in neck, no dysphagia/odynophagia, no hoarseness Cardiovascular: no CP/SOB/palpitations/leg swelling Respiratory: no cough/SOB Gastrointestinal: no N/V/D/C Musculoskeletal: no muscle/+ joint aches Skin: + Itching, + hair loss Neurological: no tremors/numbness/tingling/dizziness Psychiatric: no depression/anxiety  Past Medical History:  Diagnosis Date   Thyroid cancer (HCC)    Past Surgical History:  Procedure Laterality Date   BREAST EXCISIONAL BIOPSY Left    Social History   Socioeconomic History   Marital status: Married    Spouse name: Not on file   Number of children: 1   Years of education: Not on file   Highest education level: Not on file  Occupational History   Occupation: Teacher, English as a foreign language  Tobacco Use   Smoking status: Former    Types: Cigarettes    Quit date: 2011    Years since quitting: 13.3   Smokeless tobacco: Not on file  Substance and Sexual Activity   Alcohol use: Yes    Comment: 2-3x a week   Drug use:  Not on file   Sexual activity: Not on file  Other Topics Concern   Not on file  Social History Narrative   Not on file   Social Determinants of Health   Financial Resource Strain: Not on file  Food Insecurity: Not on file  Transportation Needs: Not on file  Physical Activity: Not on file  Stress: Not on file  Social Connections: Not on file  Intimate Partner Violence: Not on file   Current Outpatient Medications on File Prior to Visit  Medication Sig Dispense Refill   ARMOUR THYROID 240 MG tablet TAKE 1 TABLET (240 MG) BY MOUTH DAILY ON AN EMPTY STOMACH     chlorthalidone (HYGROTON) 25 MG tablet Take 1 tablet by mouth daily.     ketoconazole (NIZORAL) 2 % cream APPLY TO THE AFFECTED AREA(S) BY TOPICAL ROUTE ONCE DAILY     losartan (COZAAR) 50 MG tablet Take 1 tablet by mouth daily.     metFORMIN (GLUCOPHAGE-XR) 500 MG 24 hr tablet TAKE 1 TABLET BY MOUTH EVERY DAY WITH EVENING MEAL     rosuvastatin (CRESTOR) 10 MG tablet Take 1  tablet by mouth daily.     No current facility-administered medications on file prior to visit.   No Known Allergies Family History  Problem Relation Age of Onset   Congestive Heart Failure Mother    Congestive Heart Failure Maternal Grandfather   + Also, grandmother with diabetes, HTN, HL. + Grandfather with prostate cancer  PE: BP 112/82 (BP Location: Left Arm, Patient Position: Sitting, Cuff Size: Normal)   Pulse 92   Wt 235 lb 3.2 oz (106.7 kg)   SpO2 99%  Wt Readings from Last 3 Encounters:  07/06/22 235 lb 3.2 oz (106.7 kg)   Constitutional: Overweight, in NAD Eyes:  EOMI, no exophthalmos ENT: no neck masses palpated, thyroid scar inconspicuous, no cervical lymphadenopathy Cardiovascular: Tachycardia, RR, No MRG Respiratory: CTA B Musculoskeletal: no deformities Skin:no rashes Neurological: + Faint tremor with outstretched hands  ASSESSMENT: 1. Thyroid cancer - see HPI  2. Postsurgical Hypothyroidism  PLAN:  1.  Papillary thyroid cancer -follicular variant - I had a long discussion with the patient about her thyroid cancer history. We reviewed together the pathology >> she is stage 1 TNM: The tumor was small, subcentimeter, unifocal, with clear margins and without lymphovascular invasion - I reassured her that papillary thyroid cancer is a slow growing cancer with good prognosis, although the follicular variant has a slightly higher risk of vascular extension -She had total thyroidectomy in 2009, and then RAI treatment.  She had 3 whole-body scans, with the last in 2014, which showed no abnormal sites of uptake.  She also had several to check and 1 biopsy (benign) to investigate enlarged lymph nodes.  The lymph nodes were less conspicuous over time except for a posterior right lymph node which appeared benign (had a fatty hilum) in 2022 -Per patient recall, she did not have thyroglobulin levels checked. -we will check a thyroglobulin and ATA antibodies -At today's  visit I also ordered a new U/S to check the thyroid bed and surrounding lymph nodes - I will then see the patient in approximately 6 months  2.  Patient with h/o total thyroidectomy for cancer, now with iatrogenic hypothyroidism, on desiccated thyroid extract -Patient was initially been started on levothyroxine, reportedly low dose >> felt poorly, again with treatment planning to see an integrative medicine provider, which started her on Armour.  We discussed that Armour is a reasonable alternative  to levothyroxine.  However, she is on a very high dose, 240 mg daily abnormal, the equivalent of 400 mcg of levothyroxine she tells me that she feels hyperthyroid and she actually feels better when she is off Armour for few days, but then started to feel poorly.  This is expected from over replacement with thyroid hormones. -No TSH levels are available for review, but I expect that the TSH is quite suppressed No results found for: "TSH" -We discussed about possible side effects of thyrotoxicosis (including iatrogenic), to include cardiac arrhythmias, congestive heart failure, increased risk of strokes or heart attacks, osteoporosis, etc. -We will check her TFTs at today's visit (TSH, free T4, free T3) and change the Armour dose accordingly.  We could switch to levothyroxine afterwards but I will probably get her thyroid tests as close to normal as possible first.  She agrees with this plan. - we discussed about taking the thyroid hormone every day, with water, >30 minutes before breakfast, separated by >4 hours from acid reflux medications, calcium, iron, multivitamins. Pt. is taking it correctly. -I plan to repeat her TFTs in 1.5 months - I we will see her back in 3-4 months  Orders Placed This Encounter  Procedures   US THYROID   TSH   T4, free   T3, free   Thyroglobulin Level   Thyroglobulin antibody   Refills: [facility] [address]  - Total time spent for the visit: 1 hour, in precharting, post  charting, obtaining medical information from the chart and from the patient, reviewing previous labs, imaging evaluations, and treatments, reviewing her symptoms, counseling her about her thyroid conditions (please see the discussed topics above), and developing a plan to further investigate and treat them; she had a number of questions which I addressed.    Component     Latest Ref Rng 07/06/2022  TSH     0.35 - 5.50 uIU/mL 1.80   T4,Free(Direct)     0.60 - 1.60 ng/dL 1.61   Triiodothyronine,Free,Serum     2.3 - 4.2 pg/mL 3.8   Thyroglobulin     ng/mL <0.1 (L)   Comment --   Thyroglobulin Ab     < or = 1 IU/mL <1   Thyroglobulin and ATA antibodies are undetectable, which is excellent news. However, I am very surprised that her thyroid hormones are completely normal on such a high dose of Armour Thyroid.  It is possible that missing the medication before could have the relative iatrogenic thyrotoxicosis. I will suggest a lower dose of Armour dose, 180 mg daily, and repeat her tests in approximately 5 weeks and see if we need to change the dose at that time.  Neck U/S (07/25/2022): Surgical changes of total thyroidectomy without evidence of residual or recurrent thyroid tissue or lymphadenopathy.   Carlus Pavlov, MD PhD [facility]

## 2022-07-06 NOTE — Patient Instructions (Signed)
Please continue Armour Thyroid 240 mg daily.  Take the thyroid hormone every day, with water, at least 30 minutes before breakfast, separated by at least 4 hours from: - acid reflux medications - calcium - iron - multivitamins  Please stop at the lab.  We will get a neck ultrasound - expect a call from [facility].  Please return in 3-4 months.

## 2022-07-07 ENCOUNTER — Encounter: Payer: Self-pay | Admitting: Internal Medicine

## 2022-07-07 LAB — THYROGLOBULIN LEVEL: Thyroglobulin: <0.1 ng/mL — ABNORMAL LOW

## 2022-07-07 LAB — THYROGLOBULIN ANTIBODY: Thyroglobulin Ab: <1 IU/mL (ref <=1)

## 2022-07-11 ENCOUNTER — Encounter: Payer: Self-pay | Admitting: Internal Medicine

## 2022-07-11 DIAGNOSIS — E890 Postprocedural hypothyroidism: Secondary | ICD-10-CM

## 2022-07-11 NOTE — Telephone Encounter (Signed)
FYI: Attempted to call the patient around 12:55 PM today but was only able to leave a message.  I will retry later today.

## 2022-07-11 NOTE — Telephone Encounter (Signed)
Again called the pt. about changing the Armour dose but was not able to get in touch with her and again left a message.  I advised her to send me a message through MyChart with her questions about the Armour dose.

## 2022-07-12 ENCOUNTER — Ambulatory Visit
Admission: RE | Admit: 2022-07-12 | Discharge: 2022-07-12 | Disposition: A | Discharge status: Home / Self Care | Payer: BLUE CROSS/BLUE SHIELD | Source: Ambulatory Visit | Attending: Obstetrics and Gynecology | Admitting: Obstetrics and Gynecology

## 2022-07-12 ENCOUNTER — Other Ambulatory Visit: Payer: Self-pay | Admitting: Internal Medicine

## 2022-07-12 DIAGNOSIS — E890 Postprocedural hypothyroidism: Secondary | ICD-10-CM

## 2022-07-12 DIAGNOSIS — N6310 Unspecified lump in the right breast, unspecified quadrant: Secondary | ICD-10-CM

## 2022-07-12 MED ORDER — ARMOUR THYROID 180 MG PO TABS
180.0000 mg | ORAL_TABLET | Freq: Every day | ORAL | 3 refills | Status: DC
Start: 2022-07-12 — End: 2022-07-12

## 2022-07-12 MED ORDER — ARMOUR THYROID 180 MG PO TABS: 180.0000 mg | ORAL_TABLET | Freq: Every day | ORAL | 3 refills | Status: DC

## 2022-07-25 ENCOUNTER — Ambulatory Visit
Admission: RE | Admit: 2022-07-25 | Discharge: 2022-07-25 | Disposition: A | Discharge status: Home / Self Care | Payer: BC Managed Care – PPO | Source: Ambulatory Visit | Attending: Internal Medicine | Admitting: Internal Medicine

## 2022-07-25 DIAGNOSIS — C73 Malignant neoplasm of thyroid gland: Secondary | ICD-10-CM

## 2022-09-22 ENCOUNTER — Telehealth: Payer: Self-pay

## 2022-09-22 ENCOUNTER — Other Ambulatory Visit: Payer: Self-pay

## 2022-09-22 NOTE — Telephone Encounter (Signed)
Pt called and wanted to know if you could refill her other meds that you do not see her for? I advised to her that you probably wouldn't because you only see her for thyroid but I advised to her that I would send a message.

## 2022-09-25 ENCOUNTER — Ambulatory Visit: Payer: BC Managed Care – PPO | Admitting: Internal Medicine

## 2022-09-25 ENCOUNTER — Encounter: Payer: Self-pay | Admitting: Internal Medicine

## 2022-09-25 VITALS — BP 134/80 | HR 91 | Wt 245.4 lb

## 2022-09-25 DIAGNOSIS — E890 Postprocedural hypothyroidism: Secondary | ICD-10-CM

## 2022-09-25 DIAGNOSIS — C73 Malignant neoplasm of thyroid gland: Secondary | ICD-10-CM

## 2022-09-25 NOTE — Progress Notes (Unsigned)
Patient ID: Jacqueline Calderon, female   DOB: 1976/03/06, 47 y.o.   MRN: [id number]  HPI  Jacqueline Calderon is a 47 y.o.-year-old female, initially referred by Dr. 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  Last visit 3 months ago. She previously saw Dr. Sharl Ma and then Dr. Talmage Nap.  Interim history: At today's visit, she tells me that she feels very tired, gained weight, also having HAs. She has heat and cold intolerance. She ran out of her diuretic for 2 weeks and she just restarted chlorthalidone this morning.  Reviewed history: Patient describes that she was found to have thyroid nodules during an exam in her OB/GYN office in 2009.  She was sent for a thyroid ultrasound, and then a biopsy, which confirmed thyroid cancer.  She had total thyroidectomy in 2009 and she mentions that she was started on a low-dose of levothyroxine afterwards.  She started to gain a lot of weight and felt poorly.  She saw [facility] and she was switched to Armour.  The dose was gradually increased over the years.  At our first visit in 06/2022, she described palpitations and tremors and overall signs of thyrotoxicosis at times, but sometimes feeling tired and she also had weight gain.  She saw endocrinology in the past, but not in few years prior to our first appointment.  Refills were per by PCP, but she mentioned that she had pauses in her Armour course at this was not refilled unless she went in person for an appointment.  She was trying to establish care with another PCP.    Dr. Vincente Poli recommended that she establish care with me.  Reviewed thyroid cancer history: - dx in 2009  Total thyroidectomy (01/24/2008-Dr. Gerkin)  THYROID, THYROIDECTOMY:   - PAPILLARY THYROID CARCINOMA, 0.5 CM.   - SEE ONCOLOGY TABLE BELOW.   1. Maximum tumor size (cm): 0.5 cm   2. Tumor location: Left upper lobe   3. Multifocality: No   4.  Histology: Papillary thyroid carcinoma, follicular variant   5. Margins: Negative for carcinoma   6. Capsular invasion: N/A   7. Extrathyroidal extension: Not identified   8. Vascular/Lymphatic invasion: Not identified   9. Lymph nodes: # examined 0; # positive 0   10. TNM code: pT1a, pNX, pMX   11. Non-neoplastic thyroid: Adenomatous nodules (2)   12. Comments: The 0.5 cm nodule in the left upper   thyroid lobe has features of follicular variant of papillary   thyroid carcinoma. The remaining two grossly identifiable   nodules are adenomatous nodules.   RAI treatment (06/04/2008): 104.5 mCi  Whole-body scan (06/16/2018): 1.  Residual functioning thyroid tissue within the thyroid bed.  2.  No evidence for distant metastatic disease.   Thyroid ultrasound (10/21/2008): Findings: The right thyroid lobe, left thyroid lobe, and isthmus  are surgically absent status post total thyroidectomy.  Imaging of  the thyroid bed demonstrates a 0.9 x 0.7 x 0.5 cm lymph node at the  expected location of the isthmus.  Right superolateral lymph node measures 2.9 x 1.4 x 0.6 cm.  Left superolateral lymph node measures 2.9 x 1.3 x 0.9 cm.  These lateral lymph nodes as measured above appear to lack central  fatty hila, with cortical thickening.    Other smaller normal appearing lymph nodes are noted bilaterally.    IMPRESSION:  Bilateral abnormal appearing lymph nodes as above; differential  considerations include neoplastic versus infectious/inflammatory  involvement.  Consider ultrasound guided biopsy/aspiration for  further evaluation.   FNA  Left LN (11/05/2008):  SMEARS AND CELL BLOCK: ABUNDANT LYMPHOCYTES. NO METASTATIC   CARCINOMA IDENTIFIED.   Whole-body scan (04/08/2009): No areas of abnormal uptake  Whole-body scan (09/02/2012): No areas of abnormal uptake  Neck ultrasound (12/21/2010): Findings: Small irregular hyperechoic area in the soft tissues just  superior to the isthmus in the  midline again visualized and stable  in appearance since the prior study.  This has an appearance  suggestive of scar tissue.   Several small lymph nodes are visualized in the left and right  neck.  Left cervical nodes are significantly less prominent  compared to the prior study with no dominant enlarged lymph node  remaining.  The largest lymph node in the right neck is stable in  size and appearance, measuring roughly 1.6 x 0.5 x 1.2 cm.   IMPRESSION:  No evidence of enlarging lymph nodes or abnormal soft tissue  nodules.  Hyperechoic area in the midline is stable.  Lymph nodes  in the left neck are less prominent.  The largest lymph node in the  right neck is stable.   Neck ultrasound (12/25/2011): Lymphadenopathy: 1 right: 1.6 x 0.4 x 1 cm 2 left: 1.6 x 0.6 x 1 cm and 1.2 x 0.5 x 0.8 cm IMPRESSION:  1. Thyroidectomy without evidence of recurrent disease.  2.  Stable bilateral neck lymph nodes.    Neck ultrasound (08/10/2020): Ultrasound performed of the right posterior neck area of concern. In this region, there are superficial small adjacent hypoechoic nodules with fatty vascular hila measuring 6 x 4 x 7 mm and 8 x 6 x 6 mm. These are small superficial benign-appearing lymph nodes.  IMPRESSION: Right posterior neck palpable abnormality correlates with small superficial benign-appearing lymph nodes.   Neck U/S (07/25/2022): Surgical changes of total thyroidectomy without evidence of residual or recurrent thyroid tissue or lymphadenopathy.  Reviewed thyroglobulin and antithyroglobulin antibody levels: Lab Results  Component Value Date   THYROGLB <0.1 (L) 07/06/2022   Lab Results  Component Value Date   THGAB <1 07/06/2022   Pt denies: - feeling nodules in neck - hoarseness - dysphagia - choking  Postsurgical hypothyroidism:  At our first visit, she was on Armour 240 mg daily (equivalent of 400 mcg LT4 daily!). In 06/2022, we decreased the dose to 180 mg daily  (equivalent of 300 mcg LT4 daily). - missed one dose - in am - on the nightstand - fasting - at least 30 min from b'fast - no calcium - no iron - no multivitamins - no PPIs - not on Biotin - on B12  No thyroid tests available for review: Lab Results  Component Value Date   TSH 1.80 07/06/2022   FREET4 0.61 07/06/2022    At last visit, she described: - fatigue - weight gain - cold intolerance - constipation - dry skin - hair loss - depression  She has + FH of thyroid disorders in: maternal uncle. No FH of thyroid cancer.  No h/o radiation tx to head or neck aside RAI tx.  She has a FH of CHF in mother and M Grandfather and MGGM.  ROS: + See HPI  Past Medical History:  Diagnosis Date   Thyroid cancer [facility])    Past Surgical History:  Procedure Laterality Date   BREAST EXCISIONAL BIOPSY Left    CESAREAN SECTION  04/21/2010   THYROID SURGERY     total thyroidectomy - Dr Gerrit Friends 01/24/2008  Social History   Socioeconomic History   Marital status: Married    Spouse name: Not on file   Number of children: 1   Years of education: Not on file   Highest education level: Not on file  Occupational History   Occupation: Teacher, English as a foreign language  Tobacco Use   Smoking status: Former    Types: Cigarettes    Quit date: 2011    Years since quitting: 13.5   Smokeless tobacco: Not on file  Substance and Sexual Activity   Alcohol use: Yes    Comment: 2-3x a week   Drug use: Not on file   Sexual activity: Not on file  Other Topics Concern   Not on file  Social History Narrative   Not on file   Social Determinants of Health   Financial Resource Strain: Not on file  Food Insecurity: Not on file  Transportation Needs: Not on file  Physical Activity: Not on file  Stress: Not on file  Social Connections: Not on file  Intimate Partner Violence: Not on file   Current Outpatient Medications on File Prior to Visit  Medication Sig Dispense Refill   ARMOUR THYROID 180 MG tablet  Take 1 tablet (180 mg total) by mouth daily. 45 tablet 3   chlorthalidone (HYGROTON) 25 MG tablet Take 1 tablet by mouth daily.     ketoconazole (NIZORAL) 2 % cream APPLY TO THE AFFECTED AREA(S) BY TOPICAL ROUTE ONCE DAILY     losartan (COZAAR) 50 MG tablet Take 1 tablet by mouth daily.     metFORMIN (GLUCOPHAGE-XR) 500 MG 24 hr tablet TAKE 1 TABLET BY MOUTH EVERY DAY WITH EVENING MEAL     rosuvastatin (CRESTOR) 10 MG tablet Take 1 tablet by mouth daily.     No current facility-administered medications on file prior to visit.   No Known Allergies Family History  Problem Relation Age of Onset   Congestive Heart Failure Mother    Congestive Heart Failure Maternal Grandfather   + Also, grandmother with diabetes, HTN, HL. + Grandfather with prostate cancer  PE: BP 134/80   Pulse 91   Wt 245 lb 6.4 oz (111.3 kg)   SpO2 99%  Wt Readings from Last 3 Encounters:  09/25/22 245 lb 6.4 oz (111.3 kg)  07/06/22 235 lb 3.2 oz (106.7 kg)   Constitutional: Overweight, in NAD Eyes:  EOMI, no exophthalmos ENT: no neck masses palpated, thyroid scar inconspicuous, no cervical lymphadenopathy Cardiovascular: RRR, No MRG Respiratory: CTA B Musculoskeletal: no deformities Skin:no rashes Neurological: + Faint tremor with outstretched hands  ASSESSMENT: 1. Thyroid cancer - see HPI  2. Postsurgical Hypothyroidism  PLAN:  1.  Papillary thyroid cancer -follicular variant - I had a long discussion with the patient about her thyroid cancer history. We reviewed together the pathology >> she is stage 1 TNM: The tumor was small, subcentimeter, unifocal, with clear margins and without lymphovascular invasion - I reassured her that papillary thyroid cancer is a slow growing cancer with good prognosis, although the follicular variant has a slightly higher risk of vascular extension -She had total thyroidectomy and then RAI treatment in 2009.  She had 3 whole-body scans, with the last in 2014, which showed  no abnormal sites of uptake.  She also had several to check and 1 biopsy (benign) to investigate enlarged lymph nodes.  The lymph nodes were less conspicuous over time except for a posterior right lymph node which appeared benign (had a fatty hilum) in 2022. -At last visit, we  checked a new neck ultrasound and this did not show any signs of recurrence or any abnormal neck masses (07/25/2022). -At that time, thyroglobulin and ATA antibodies are undetectable.  We will not repeat these today.  Per her recall, she did not have thyroglobulin levels checked in the past. -See her back in 6 months  2.  Patient with h/o total thyroidectomy for cancer, now with iatrogenic hypothyroidism, on  desiccated thyroid extract.  -She was initially started on levothyroxine, low-dose reportedly, on which she felt poorly.  She started to see an integrative medicine provider, who started her on Armour.  She was on a very high dose, 240 mg daily, the equivalent of 400 mcg of levothyroxine and she was telling me that she felt hyperthyroid on this and actually felt better when she was off Armour for few days but afterwards she started to feel poorly.  I advised her that this is expected from over replacement with thyroid hormones. -At last visit, the TSH was normal, and I suspected that this was probably due to missing Armour doses: Lab Results  Component Value Date   TSH 1.80 07/06/2022  -We discussed about possible side effects of thyrotoxicosis (even with iatrogenic ~), to include cardiac arrhythmias, congestive heart failure, increased risk of strokes or heart attacks, osteoporosis, etc. -I recommended to reduce the dose of Armour to 180 mg daily - she tells me that she does not feel very good on this dose, she feels tired, feels she gained weight (however, she was off chlorthalidone and just restarted this morning), and also has headaches.  We discussed that we may need to increase the Armour dose, however, I am concerned  about her feeling poorly upon increasing the dose and we discussed about the fact that this could possibly be related to too much free T3.  If she starts feeling poorly on a higher dose of Armour, we discussed about potentially replacing Armour with LT4 + LT3, which is a more adjustable regimen.  She agrees with this plan. - we discussed about taking the thyroid hormone every day, with water, >30 minutes before breakfast, separated by >4 hours from acid reflux medications, calcium, iron, multivitamins. Pt. is taking it correctly. - will check thyroid tests today: TSH, fT3, and fT4 - If labs are abnormal, she will need to return for repeat TFTs in 1.5 months - I will see her back in 6 months  We could try LT4 + LT3. Needs refills.  Carlus Pavlov, MD PhD [facility]

## 2022-09-25 NOTE — Patient Instructions (Addendum)
Please continue Armour 180 mg daily.  Take the thyroid hormone every day, with water, at least 30 minutes before breakfast, separated by at least 4 hours from: - acid reflux medications - calcium - iron - multivitamins  Please stop at the lab.  Please return for a follow-up appointment in 6 months, but likely sooner for labs.

## 2022-09-26 LAB — T3, FREE: T3, Free: 2.7 pg/mL (ref 2.3–4.2)

## 2022-09-26 LAB — TSH: TSH: 30.96 u[IU]/mL — ABNORMAL HIGH (ref 0.35–5.50)

## 2022-09-26 LAB — T4, FREE: Free T4: 0.32 ng/dL — ABNORMAL LOW (ref 0.60–1.60)

## 2022-09-26 MED ORDER — LEVOTHYROXINE SODIUM 175 MCG PO TABS: 350.0000 ug | ORAL_TABLET | Freq: Every day | ORAL | 3 refills | Status: DC

## 2022-09-26 MED ORDER — LIOTHYRONINE SODIUM 5 MCG PO TABS: 10.0000 ug | ORAL_TABLET | Freq: Every day | ORAL | 3 refills | Status: DC

## 2023-01-25 ENCOUNTER — Encounter: Payer: Self-pay | Admitting: Internal Medicine

## 2023-01-26 ENCOUNTER — Other Ambulatory Visit: Payer: Self-pay | Admitting: Internal Medicine

## 2023-01-26 ENCOUNTER — Telehealth: Payer: Self-pay

## 2023-01-26 ENCOUNTER — Encounter: Payer: Self-pay | Admitting: Internal Medicine

## 2023-01-26 ENCOUNTER — Other Ambulatory Visit (INDEPENDENT_AMBULATORY_CARE_PROVIDER_SITE_OTHER): Payer: BC Managed Care – PPO

## 2023-01-26 DIAGNOSIS — E890 Postprocedural hypothyroidism: Secondary | ICD-10-CM

## 2023-01-26 LAB — T4, FREE: Free T4: 2.05 ng/dL — ABNORMAL HIGH (ref 0.60–1.60)

## 2023-01-26 LAB — T3, FREE: T3, Free: 4.9 pg/mL — ABNORMAL HIGH (ref 2.3–4.2)

## 2023-01-26 LAB — TSH: TSH: <0.01 u[IU]/mL — ABNORMAL LOW (ref 0.35–5.50)

## 2023-01-26 MED ORDER — LEVOTHYROXINE SODIUM 300 MCG PO TABS: 300.0000 ug | ORAL_TABLET | Freq: Every day | ORAL | 3 refills | Status: DC

## 2023-01-26 NOTE — Telephone Encounter (Signed)
Nykisha understands to stop the Cytomel completely and take 300 mcg of levothyroxine

## 2023-01-26 NOTE — Telephone Encounter (Signed)
Dr Elvera Lennox on [facility] work you advised her to [facility] a 300 mcg levothyroxine dose daily and suggested to eliminate Cytomel completely, she is calling totally confused because she states she doesn't take Cytomel , please advise?

## 2023-03-28 ENCOUNTER — Encounter: Payer: Self-pay | Admitting: Internal Medicine

## 2023-03-28 ENCOUNTER — Ambulatory Visit: Payer: BC Managed Care – PPO | Admitting: Internal Medicine

## 2023-03-28 VITALS — BP 124/70 | HR 104 | Ht 65.0 in | Wt 232.8 lb

## 2023-03-28 DIAGNOSIS — E890 Postprocedural hypothyroidism: Secondary | ICD-10-CM

## 2023-03-28 DIAGNOSIS — C73 Malignant neoplasm of thyroid gland: Secondary | ICD-10-CM | POA: Diagnosis not present

## 2023-03-28 NOTE — Progress Notes (Signed)
 Patient ID: Jacqueline Calderon, female   DOB: 1976/02/01, 48 y.o.   MRN: [id number]  HPI  Jacqueline Calderon is a 48 y.o.-year-old female, initially referred by Dr. 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  Last visit 6 months ago. She previously saw Dr. Faythe and then Dr. Tommas.  Interim history: At last OV, she felt very tired, gained weight, also having HAs. She had heat and cold intolerance.  However, since changing her thyroid  regimen in 01/2023, she started to feel much better.  Reviewed history: Patient describes that she was found to have thyroid  nodules during an exam in her OB/GYN office in 2009.  She was sent for a thyroid  ultrasound, and then a biopsy, which confirmed thyroid  cancer.  She had total thyroidectomy in 2009 and she mentions that she was started on a low-dose of levothyroxine  afterwards.  She started to gain a lot of weight and felt poorly.  She saw [facility] and she was switched to Armour.  The dose was gradually increased over the years.  At our first visit in 06/2022, she described palpitations and tremors and overall signs of thyrotoxicosis at times, but sometimes feeling tired and she also had weight gain.  She saw endocrinology in the past, but not in few years prior to our first appointment.  Refills were per by PCP, but she mentioned that she had pauses in her Armour course at this was not refilled unless she went in person for an appointment.  She was trying to establish care with another PCP.    Dr. Mat recommended that she establish care with me.  Reviewed thyroid  cancer history: - dx in 2009  Total thyroidectomy (01/24/2008-Dr. Gerkin)  THYROID , THYROIDECTOMY:   - PAPILLARY THYROID  CARCINOMA, 0.5 CM.   - SEE ONCOLOGY TABLE BELOW.   1. Maximum tumor size (cm): 0.5 cm   2. Tumor location: Left upper lobe   3. Multifocality: No   4. Histology: Papillary thyroid   carcinoma, follicular variant   5. Margins: Negative for carcinoma   6. Capsular invasion: N/A   7. Extrathyroidal extension: Not identified   8. Vascular/Lymphatic invasion: Not identified   9. Lymph nodes: # examined 0; # positive 0   10. TNM code: pT1a, pNX, pMX   11. Non-neoplastic thyroid : Adenomatous nodules (2)   12. Comments: The 0.5 cm nodule in the left upper   thyroid  lobe has features of follicular variant of papillary   thyroid  carcinoma. The remaining two grossly identifiable   nodules are adenomatous nodules.   RAI treatment (06/04/2008): 104.5 mCi  Whole-body scan (06/16/2018): 1.  Residual functioning thyroid  tissue within the thyroid  bed.  2.  No evidence for distant metastatic disease.   Thyroid  ultrasound (10/21/2008): Findings: The right thyroid  lobe, left thyroid  lobe, and isthmus  are surgically absent status post total thyroidectomy.  Imaging of  the thyroid  bed demonstrates a 0.9 x 0.7 x 0.5 cm lymph node at the  expected location of the isthmus.  Right superolateral lymph node measures 2.9 x 1.4 x 0.6 cm.  Left superolateral lymph node measures 2.9 x 1.3 x 0.9 cm.  These lateral lymph nodes as measured above appear to lack central  fatty hila, with cortical thickening.    Other smaller normal appearing lymph nodes are noted bilaterally.    IMPRESSION:  Bilateral abnormal appearing lymph nodes as above; differential  considerations include neoplastic versus infectious/inflammatory  involvement.  Consider ultrasound guided biopsy/aspiration for  further evaluation.   FNA  Left LN (11/05/2008):  SMEARS AND CELL BLOCK: ABUNDANT LYMPHOCYTES. NO METASTATIC   CARCINOMA IDENTIFIED.   Whole-body scan (04/08/2009): No areas of abnormal uptake  Whole-body scan (09/02/2012): No areas of abnormal uptake  Neck ultrasound (12/21/2010): Findings: Small irregular hyperechoic area in the soft tissues just  superior to the isthmus in the midline again visualized and  stable  in appearance since the prior study.  This has an appearance  suggestive of scar tissue.   Several small lymph nodes are visualized in the left and right  neck.  Left cervical nodes are significantly less prominent  compared to the prior study with no dominant enlarged lymph node  remaining.  The largest lymph node in the right neck is stable in  size and appearance, measuring roughly 1.6 x 0.5 x 1.2 cm.   IMPRESSION:  No evidence of enlarging lymph nodes or abnormal soft tissue  nodules.  Hyperechoic area in the midline is stable.  Lymph nodes  in the left neck are less prominent.  The largest lymph node in the  right neck is stable.   Neck ultrasound (12/25/2011): Lymphadenopathy: 1 right: 1.6 x 0.4 x 1 cm 2 left: 1.6 x 0.6 x 1 cm and 1.2 x 0.5 x 0.8 cm IMPRESSION:  1. Thyroidectomy without evidence of recurrent disease.  2.  Stable bilateral neck lymph nodes.    Neck ultrasound (08/10/2020): Ultrasound performed of the right posterior neck area of concern. In this region, there are superficial small adjacent hypoechoic nodules with fatty vascular hila measuring 6 x 4 x 7 mm and 8 x 6 x 6 mm. These are small superficial benign-appearing lymph nodes.  IMPRESSION: Right posterior neck palpable abnormality correlates with small superficial benign-appearing lymph nodes.   Neck U/S (07/25/2022): Surgical changes of total thyroidectomy without evidence of residual or recurrent thyroid  tissue or lymphadenopathy.  Reviewed thyroglobulin and antithyroglobulin antibody levels: Lab Results  Component Value Date   THYROGLB <0.1 (L) 07/06/2022   Lab Results  Component Value Date   THGAB <1 07/06/2022   Pt denies: - feeling nodules in neck - hoarseness - dysphagia - choking  Postsurgical hypothyroidism:  At our first visit, she was on Armour 240 mg daily (equivalent of 400 mcg LT4 daily!). In 06/2022, we decreased the dose to 180 mg daily (equivalent of 300 mcg LT4  daily). In 09/2022, we changed to: 2 tablets of 175 mcg LT4 2 tablets of 5 mcg LT3 daily  for an equivalent to 390 mcg LT4 daily. In 01/2023, we changed to: LT4 300 mcg daily  - missed 2 doses during the holidays - in am - on the nightstand - fasting - at least 30 min from b'fast - no calcium - no iron - no multivitamins - no PPIs - not on Biotin - on B12  No thyroid  tests available for review: Lab Results  Component Value Date   TSH <0.01 Repeated and verified X2. (L) 01/26/2023   TSH 30.96 (H) 09/25/2022   TSH 1.80 07/06/2022   FREET4 2.05 (H) 01/26/2023   FREET4 0.32 (L) 09/25/2022   FREET4 0.61 07/06/2022    At last visit, she described: - fatigue - weight gain - cold intolerance - constipation - dry skin - hair loss - depression  She has + FH of thyroid  disorders in: maternal uncle. No FH of thyroid  cancer.  No h/o radiation tx to head or neck aside RAI tx.  She has a FH of CHF  in mother and M Grandfather and MGGM.  ROS: + See HPI  Past Medical History:  Diagnosis Date   Thyroid  cancer [facility])    Past Surgical History:  Procedure Laterality Date   BREAST EXCISIONAL BIOPSY Left    CESAREAN SECTION  04/21/2010   THYROID  SURGERY     total thyroidectomy - Dr Eletha 01/24/2008   Social History   Socioeconomic History   Marital status: Married    Spouse name: Not on file   Number of children: 1   Years of education: Not on file   Highest education level: Not on file  Occupational History   Occupation: teacher, english as a foreign language  Tobacco Use   Smoking status: Former    Current packs/day: 0.00    Types: Cigarettes    Quit date: 2011    Years since quitting: 14.0   Smokeless tobacco: Not on file  Substance and Sexual Activity   Alcohol use: Yes    Comment: 2-3x a week   Drug use: Not on file   Sexual activity: Not on file  Other Topics Concern   Not on file  Social History Narrative   Not on file   Social Drivers of Health   Financial Resource Strain:  Not on file  Food Insecurity: Not on file  Transportation Needs: Not on file  Physical Activity: Not on file  Stress: Not on file  Social Connections: Unknown (07/31/2021)   Received from [facility]   Social Network    Social Network: Not on file  Intimate Partner Violence: Unknown (06/24/2021)   Received from [facility]   HITS    Physically Hurt: Not on file    Insult or Talk Down To: Not on file    Threaten Physical Harm: Not on file    Scream or Curse: Not on file   Current Outpatient Medications on File Prior to Visit  Medication Sig Dispense Refill   chlorthalidone (HYGROTON) 25 MG tablet Take 1 tablet by mouth daily.     ketoconazole (NIZORAL) 2 % cream APPLY TO THE AFFECTED AREA(S) BY TOPICAL ROUTE ONCE DAILY     levothyroxine  (SYNTHROID ) 300 MCG tablet Take 1 tablet (300 mcg total) by mouth daily. 60 tablet 3   losartan (COZAAR) 50 MG tablet Take 1 tablet by mouth daily.     metFORMIN  (GLUCOPHAGE -XR) 500 MG 24 hr tablet TAKE 1 TABLET BY MOUTH EVERY DAY WITH EVENING MEAL     rosuvastatin (CRESTOR) 10 MG tablet Take 1 tablet by mouth daily.     No current facility-administered medications on file prior to visit.   No Known Allergies Family History  Problem Relation Age of Onset   Congestive Heart Failure Mother    Congestive Heart Failure Maternal Grandfather   + Also, grandmother with diabetes, HTN, HL. + Grandfather with prostate cancer  PE: BP 124/70   Pulse (!) 104   Ht 5' 5 (1.651 m)   Wt 232 lb 12.8 oz (105.6 kg)   SpO2 96%   BMI 38.74 kg/m  Wt Readings from Last 3 Encounters:  03/28/23 232 lb 12.8 oz (105.6 kg)  09/25/22 245 lb 6.4 oz (111.3 kg)  07/06/22 235 lb 3.2 oz (106.7 kg)   Constitutional: Overweight, in NAD Eyes:  EOMI, no exophthalmos ENT: no neck masses palpated, thyroid  scar inconspicuous, no cervical lymphadenopathy Cardiovascular: tachycardia, RR, No MRG Respiratory: CTA B Musculoskeletal: no  deformities Skin:no rashes Neurological: + Faint tremor with outstretched hands  ASSESSMENT: 1. Thyroid   cancer - see HPI  2. Postsurgical Hypothyroidism  PLAN:  1.  Papillary thyroid  cancer -follicular variant -Her cancer is stage I TNM: The tumor was small, subcentimeter, unifocal, with clear margins and without lymphovascular invasion.  We discussed that this confers a good prognosis, although the follicular variant of PTC has a slightly higher risk of vascular invasion. -She had total thyroidectomy and then RAI treatment in 2009.  She had 3 whole-body scans with the latest being in 2014, which showed no abnormal sites of uptake.  She also had several imaging tests and 1 biopsy to investigate enlarged lymph nodes (benign).  The lymph nodes were less conspicuous over time except for a posterior right lymph node which appears to be benign (with a fatty hilum) 2022.  We checked a new neck ultrasound in 07/2022 and this did not show any signs of recurrence or abnormal neck masses -She did not have thyroglobulin levels checked in the past but we checked these in 07/2022 and these were undetectable -I plan to repeat her thyroglobulin at next visit in 6 months  2.  Patient with h/o total thyroidectomy for cancer, now with uncontrolled iatrogenic hypothyroidism, on desiccated thyroid  extract -Patient was initially started on levothyroxine , on which she felt poorly.  She started to see an integrative medicine provider, who started her on Armour.  When I first saw her she was on an extremely high dose of Armour, 240 mg daily, the equivalent of 400 mcg of levothyroxine  and she was telling me that she felt hyperthyroid on this.  She stopped Armour for few days and she actually felt better off it, but then started to feel poorly again. -Discussion about the negative consequence of over replacement with thyroid  hormones to include cardiac arrhythmias, congestive heart failure, increased risk of strokes or  heart attacks, osteoporosis, etc., we decreased her Armour Thyroid  dose, however, her TSH increased on Armour 180 mg daily. She did not feel good, feeling fatigued, with weight gain, and also with headaches.  We discussed about possibly switching to levothyroxine  + liothyronine , a more adjustable regimen.  I suggested 2 tablets of 175 mcg LT4 along with 2 tablets of 5 mcg LT3 daily, for the equivalent to 390 mcg LT4 daily.  On this dose, her TSH was suppressed at last check: Lab Results  Component Value Date   TSH <0.01 Repeated and verified X2. (L) 01/26/2023  -After the above results returned, I suggested to switch to just LT4 300 mcg daily.  She continues on this dose. - pt feels much better after we switched to a LT4 only regimen.  She started to feel better approximately a week after the change.  At today's visit, her pulse is high, but she rushed over here - we discussed about taking the thyroid  hormone every day, with water, >30 minutes before breakfast, separated by >4 hours from acid reflux medications, calcium, iron, multivitamins. Pt. is taking it correctly but she remembers missing 2 doses around Christmas. - will check thyroid  tests today: TSH and fT4 - If labs are abnormal, she will need to return for repeat TFTs in 1.5 months - OTW, I will see her back in 6 months, but sooner for labs  Orders Placed This Encounter  Procedures   TSH   T4, free   Component     Latest Ref Rng 03/28/2023  TSH     mIU/L 0.01 (L)   T4,Free(Direct)     0.8 - 1.8 ng/dL 2.6 (H)   TSH is  suppressed and free T4 is high.  We can back off the LT4 dose to 250 mcg daily.  Plan to recheck her TFTs in 1.5 months.  Lela Fendt, MD PhD [facility]

## 2023-03-28 NOTE — Patient Instructions (Signed)
 Please continue levothyroxine  300 mg daily.  Take the thyroid  hormone every day, with water, at least 30 minutes before breakfast, separated by at least 4 hours from: - acid reflux medications - calcium - iron - multivitamins  Please stop at the lab.  Please return for a follow-up appointment in 6 months, but likely sooner for labs.

## 2023-03-29 LAB — TSH: TSH: 0.01 m[IU]/L — ABNORMAL LOW

## 2023-03-29 LAB — T4, FREE: Free T4: 2.6 ng/dL — ABNORMAL HIGH (ref 0.8–1.8)

## 2023-03-29 MED ORDER — LEVOTHYROXINE SODIUM 200 MCG PO TABS: 200.0000 ug | ORAL_TABLET | Freq: Every day | ORAL | 3 refills | Status: DC

## 2023-03-29 MED ORDER — LEVOTHYROXINE SODIUM 50 MCG PO TABS: 50.0000 ug | ORAL_TABLET | Freq: Every day | ORAL | 3 refills | Status: DC

## 2023-04-13 ENCOUNTER — Other Ambulatory Visit: Payer: BLUE CROSS/BLUE SHIELD

## 2023-08-24 ENCOUNTER — Other Ambulatory Visit: Payer: Self-pay | Admitting: Internal Medicine

## 2023-09-25 ENCOUNTER — Encounter: Payer: Self-pay | Admitting: Internal Medicine

## 2023-09-25 ENCOUNTER — Ambulatory Visit: Payer: BC Managed Care – PPO | Admitting: Internal Medicine

## 2023-09-25 VITALS — BP 124/78 | HR 95 | Ht 65.0 in | Wt 236.8 lb

## 2023-09-25 DIAGNOSIS — E890 Postprocedural hypothyroidism: Secondary | ICD-10-CM | POA: Diagnosis not present

## 2023-09-25 DIAGNOSIS — R81 Glycosuria: Secondary | ICD-10-CM | POA: Diagnosis not present

## 2023-09-25 DIAGNOSIS — R350 Frequency of micturition: Secondary | ICD-10-CM

## 2023-09-25 DIAGNOSIS — C73 Malignant neoplasm of thyroid gland: Secondary | ICD-10-CM | POA: Diagnosis not present

## 2023-09-25 NOTE — Progress Notes (Signed)
 Patient ID: Jacqueline Calderon, female   DOB: 01-20-1976, 48 y.o.   MRN: [id number]  HPI  Jacqueline Calderon is a 48 y.o.-year-old female, initially referred by Dr. 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  Last visit 6 months ago. She previously saw Dr. Faythe and then Dr. Tommas.  Interim history: Last year, she felt very tired, gained weight, also having HAs. She had heat and cold intolerance.  However, since changing her thyroid  regimen in 01/2023, she started to feel much better. At today's visit, she is still feeling well. Now with a little forgetfulness, hot flushes, occasionally fcold intolerance, moodiness.  She feels that this may be related to premenopause.  She also has urinary pressure and frequency but she only urinates small amounts at a time.  Reviewed history: Patient describes that she was found to have thyroid  nodules during an exam in her OB/GYN office in 2009.  She was sent for a thyroid  ultrasound, and then a biopsy, which confirmed thyroid  cancer.  She had total thyroidectomy in 2009 and she mentions that she was started on a low-dose of levothyroxine  afterwards.  She started to gain a lot of weight and felt poorly.  She saw [facility] and she was switched to Armour.  The dose was gradually increased over the years.  At our first visit in 06/2022, she described palpitations and tremors and overall signs of thyrotoxicosis at times, but sometimes feeling tired and she also had weight gain.  She saw endocrinology in the past, but not in few years prior to our first appointment.  Refills were per by PCP, but she mentioned that she had pauses in her Armour course at this was not refilled unless she went in person for an appointment.  She was trying to establish care with another PCP.    Dr. Mat recommended that she establish care with me.  Reviewed thyroid  cancer history: - dx in  2009  Total thyroidectomy (01/24/2008-Dr. Gerkin)  THYROID , THYROIDECTOMY:   - PAPILLARY THYROID  CARCINOMA, 0.5 CM.   - SEE ONCOLOGY TABLE BELOW.   1. Maximum tumor size (cm): 0.5 cm   2. Tumor location: Left upper lobe   3. Multifocality: No   4. Histology: Papillary thyroid  carcinoma, follicular variant   5. Margins: Negative for carcinoma   6. Capsular invasion: N/A   7. Extrathyroidal extension: Not identified   8. Vascular/Lymphatic invasion: Not identified   9. Lymph nodes: # examined 0; # positive 0   10. TNM code: pT1a, pNX, pMX   11. Non-neoplastic thyroid : Adenomatous nodules (2)   12. Comments: The 0.5 cm nodule in the left upper   thyroid  lobe has features of follicular variant of papillary   thyroid  carcinoma. The remaining two grossly identifiable   nodules are adenomatous nodules.   RAI treatment (06/04/2008): 104.5 mCi  Whole-body scan (06/16/2018): 1.  Residual functioning thyroid  tissue within the thyroid  bed.  2.  No evidence for distant metastatic disease.   Thyroid  ultrasound (10/21/2008): Findings: The right thyroid  lobe, left thyroid  lobe, and isthmus  are surgically absent status post total thyroidectomy.  Imaging of  the thyroid  bed demonstrates a 0.9 x 0.7 x 0.5 cm lymph node at the  expected location of the isthmus.  Right superolateral lymph node measures 2.9 x 1.4 x 0.6 cm.  Left superolateral lymph node measures 2.9 x 1.3 x 0.9 cm.  These lateral lymph nodes as measured above appear to lack central  fatty hila, with cortical thickening.    Other smaller normal appearing lymph nodes are noted bilaterally.    IMPRESSION:  Bilateral abnormal appearing lymph nodes as above; differential  considerations include neoplastic versus infectious/inflammatory  involvement.  Consider ultrasound guided biopsy/aspiration for  further evaluation.   FNA  Left LN (11/05/2008):  SMEARS AND CELL BLOCK: ABUNDANT LYMPHOCYTES. NO METASTATIC   CARCINOMA IDENTIFIED.    Whole-body scan (04/08/2009): No areas of abnormal uptake  Whole-body scan (09/02/2012): No areas of abnormal uptake  Neck ultrasound (12/21/2010): Findings: Small irregular hyperechoic area in the soft tissues just  superior to the isthmus in the midline again visualized and stable  in appearance since the prior study.  This has an appearance  suggestive of scar tissue.   Several small lymph nodes are visualized in the left and right  neck.  Left cervical nodes are significantly less prominent  compared to the prior study with no dominant enlarged lymph node  remaining.  The largest lymph node in the right neck is stable in  size and appearance, measuring roughly 1.6 x 0.5 x 1.2 cm.   IMPRESSION:  No evidence of enlarging lymph nodes or abnormal soft tissue  nodules.  Hyperechoic area in the midline is stable.  Lymph nodes  in the left neck are less prominent.  The largest lymph node in the  right neck is stable.   Neck ultrasound (12/25/2011): Lymphadenopathy: 1 right: 1.6 x 0.4 x 1 cm 2 left: 1.6 x 0.6 x 1 cm and 1.2 x 0.5 x 0.8 cm IMPRESSION:  1. Thyroidectomy without evidence of recurrent disease.  2.  Stable bilateral neck lymph nodes.    Neck ultrasound (08/10/2020): Ultrasound performed of the right posterior neck area of concern. In this region, there are superficial small adjacent hypoechoic nodules with fatty vascular hila measuring 6 x 4 x 7 mm and 8 x 6 x 6 mm. These are small superficial benign-appearing lymph nodes.  IMPRESSION: Right posterior neck palpable abnormality correlates with small superficial benign-appearing lymph nodes.   Neck U/S (07/25/2022): Surgical changes of total thyroidectomy without evidence of residual or recurrent thyroid  tissue or lymphadenopathy.  Reviewed thyroglobulin and antithyroglobulin antibody levels: Lab Results  Component Value Date   THYROGLB <0.1 (L) 07/06/2022   Lab Results  Component Value Date   THGAB <1  07/06/2022   Pt denies: - feeling nodules in neck - hoarseness - dysphagia - choking  Postsurgical hypothyroidism:  At our first visit, she was on Armour 240 mg daily (equivalent of 400 mcg LT4 daily!). In 06/2022, we decreased the dose to 180 mg daily (equivalent of 300 mcg LT4 daily). In 09/2022, we changed to: 2 tablets of 175 mcg LT4 2 tablets of 5 mcg LT3 daily  for an equivalent to 390 mcg LT4 daily. In 01/2023, we changed to LT4 300 mcg daily In 03/2023, we decreased the dose of LT4 to 250 mcg daily  - Missed one dose - in am - on the nightstand - fasting - at least 30 min from b'fast - no calcium - no iron - + multivitamins at lunchtime - no PPIs - not on Biotin - on B12  No thyroid  tests available for review: Lab Results  Component Value Date   TSH 0.01 (L) 03/28/2023   TSH <0.01 Repeated and verified X2. (L) 01/26/2023   TSH 30.96 (H) 09/25/2022   TSH 1.80 07/06/2022   FREET4 2.6 (H) 03/28/2023   FREET4 2.05 (H) 01/26/2023   FREET4 0.32 (  L) 09/25/2022   FREET4 0.61 07/06/2022    She has + FH of thyroid  disorders in: maternal uncle. No FH of thyroid  cancer.  No h/o radiation tx to head or neck aside RAI tx.  She has a FH of CHF in mother and CHRISTELLA [place] and MGGM.  She has a h/o kidney stones - last 2011 when pregnant.  ROS: + See HPI  Past Medical History:  Diagnosis Date   Thyroid  cancer [facility])    Past Surgical History:  Procedure Laterality Date   BREAST EXCISIONAL BIOPSY Left    CESAREAN SECTION  04/21/2010   THYROID  SURGERY     total thyroidectomy - Dr Eletha 01/24/2008   Social History   Socioeconomic History   Marital status: Married    Spouse name: Not on file   Number of children: 1   Years of education: Not on file   Highest education level: Not on file  Occupational History   Occupation: Teacher, English as a foreign language  Tobacco Use   Smoking status: Former    Current packs/day: 0.00    Types: Cigarettes    Quit date: 2011    Years since  quitting: 14.5   Smokeless tobacco: Not on file  Substance and Sexual Activity   Alcohol use: Yes    Comment: 2-3x a week   Drug use: Not on file   Sexual activity: Not on file  Other Topics Concern   Not on file  Social History Narrative   Not on file   Social Drivers of Health   Financial Resource Strain: Not on file  Food Insecurity: Not on file  Transportation Needs: Not on file  Physical Activity: Not on file  Stress: Not on file  Social Connections: Unknown (07/31/2021)   Received from [facility]   Social Network    Social Network: Not on file  Intimate Partner Violence: Unknown (06/24/2021)   Received from [facility]   HITS    Physically Hurt: Not on file    Insult or Talk Down To: Not on file    Threaten Physical Harm: Not on file    Scream or Curse: Not on file   Current Outpatient Medications on File Prior to Visit  Medication Sig Dispense Refill   chlorthalidone (HYGROTON) 25 MG tablet Take 1 tablet by mouth daily.     ketoconazole (NIZORAL) 2 % cream APPLY TO THE AFFECTED AREA(S) BY TOPICAL ROUTE ONCE DAILY     levothyroxine  (SYNTHROID ) 200 MCG tablet TAKE 1 TABLET BY MOUTH EVERY DAY 30 tablet 3   levothyroxine  (SYNTHROID ) 50 MCG tablet Take 1 tablet (50 mcg total) by mouth daily. 45 tablet 3   losartan (COZAAR) 50 MG tablet Take 1 tablet by mouth daily.     metFORMIN (GLUCOPHAGE-XR) 500 MG 24 hr tablet TAKE 1 TABLET BY MOUTH EVERY DAY WITH EVENING MEAL     rosuvastatin (CRESTOR) 10 MG tablet Take 1 tablet by mouth daily.     No current facility-administered medications on file prior to visit.   No Known Allergies Family History  Problem Relation Age of Onset   Congestive Heart Failure Mother    Congestive Heart Failure Maternal Grandfather   + Also, grandmother with diabetes, HTN, HL. + Grandfather with prostate cancer  PE: There were no vitals taken for this visit. Wt Readings from Last 3 Encounters:  03/28/23 232 lb 12.8 oz (105.6 kg)  09/25/22  245 lb 6.4 oz (111.3 kg)  07/06/22 235 lb 3.2 oz (106.7 kg)  Constitutional: Overweight, in NAD Eyes:  EOMI, no exophthalmos ENT: no neck masses palpated, thyroid  scar inconspicuous, no cervical lymphadenopathy Cardiovascular: tachycardia, RR, No MRG Respiratory: CTA B Musculoskeletal: no deformities Skin:no rashes Neurological: + Faint tremor with outstretched hands  ASSESSMENT: 1. Thyroid  cancer - see HPI  2. Postsurgical Hypothyroidism  3.  Urinary frequency  PLAN:  1.  Papillary thyroid  cancer -follicular variant -Patient with stage I TNM thyroid  cancer as the tumor was small, subcentimeter, unifocal, and with clear margins, and also without lymphovascular invasion.  We discussed that this confers a good prognosis, although the follicular variant of PTC has a slightly higher risk of vascular invasion. -She had total thyroidectomy and then RAI treatment in 2009.  She had 3 whole-body scans with the latest being in 2014, which showed no abnormal sites of uptake.  She also had several imaging tests and 1 biopsy to investigate enlarged lymph nodes (benign).  The lymph nodes were less conspicuous over time except for posterior right lymph node which appeared to be benign (with a fatty hilum) in 2022. Latest neck ultrasound from 07/2022 did not show any signs of recurrence or abnormal neck masses. - Thyroglobulin levels checked in 07/2022 were undetectable.  I did not find thyroglobulin levels checked previously for her. - At today's visit we will recheck her thyroglobulin and ATA antibodies  2.  Patient with h/o total thyroidectomy for cancer, now with uncontrolled iatrogenic hypothyroidism, on desiccated thyroid  extract -She was initially started on levothyroxine , but she felt poorly on it and she started to see an integrative medicine provider, who started her on Armour.  When I first saw her, she was on an extremely high dose of Armour, 240 mg daily, the equivalent of 400 mcg of  levothyroxine  and she felt hyperthyroid on this.  She actually stopped Armour for few days and felt better off it, but then started to feel poorly again. - After discussion about the negative consequences of over replacement with thyroid  hormones we started to decrease her Armour dose and then switched to levothyroxine .  She actually felt much better on LT4 compared to Armour. - At our last visit, she was on 300 mcg of LT4 and we decreased the dose to 250 mcg (03/2023) as the TSH was suppressed:  Lab Results  Component Value Date   TSH 0.01 (L) 03/28/2023  - She did not return for repeat labs afterwards to verify the dose - pt feels good on this dose now, but she did have 1 month in which she felt more tired right after the changing dose - we discussed about taking the thyroid  hormone every day, with water, >30 minutes before breakfast, separated by >4 hours from acid reflux medications, calcium, iron, multivitamins. Pt. is taking it correctly. - will check thyroid  tests today: TSH and fT4 - If labs are abnormal, she will need to return for repeat TFTs in 1.5 months - I will see her back in 6 months but possibly sooner for labs.  3.  Urinary frequency and pressure - She is not urinating large amounts, but she has to urinate frequently.  No dysuria. - she has a history of kidney stones and wonders if her symptoms are related to this.  These are not classic symptoms of nephrolithiasis but will check a urinalysis today - She has an appointment with Dr. Mat next week  Orders Placed This Encounter  Procedures   TSH   T4, free   Thyroglobulin antibody   Thyroglobulin Level  Urinalysis with Culture Reflex   Lela Fendt, MD PhD [facility]

## 2023-09-25 NOTE — Patient Instructions (Addendum)
 Please continue levothyroxine  250 mcg daily.  Take the thyroid  hormone every day, with water, at least 30 minutes before breakfast, separated by at least 4 hours from: - acid reflux medications - calcium - iron - multivitamins  Please stop at the lab.  Please return for a follow-up appointment in 6 months, but likely sooner for labs.

## 2023-09-26 ENCOUNTER — Ambulatory Visit: Payer: Self-pay | Admitting: Internal Medicine

## 2023-09-26 MED ORDER — LEVOTHYROXINE SODIUM 25 MCG PO TABS: 50.0000 ug | ORAL_TABLET | Freq: Every day | ORAL | 3 refills | Status: DC

## 2023-09-26 MED ORDER — LEVOTHYROXINE SODIUM 200 MCG PO TABS: 200.0000 ug | ORAL_TABLET | Freq: Every day | ORAL | 3 refills | Status: DC

## 2023-09-26 NOTE — Addendum Note (Signed)
 Addended by: TRIXIE FILE on: 09/26/2023 12:50 PM   Modules accepted: Orders

## 2023-09-27 LAB — URINALYSIS W MICROSCOPIC + REFLEX CULTURE
Bacteria, UA: NONE SEEN /HPF
Bilirubin Urine: NEGATIVE
Hgb urine dipstick: NEGATIVE
Hyaline Cast: NONE SEEN /LPF
Leukocyte Esterase: NEGATIVE
Nitrites, Initial: NEGATIVE
Protein, ur: NEGATIVE
RBC / HPF: NONE SEEN /HPF (ref 0–2)
Specific Gravity, Urine: 1.025 (ref 1.001–1.035)
WBC, UA: NONE SEEN /HPF (ref 0–5)
pH: 6 (ref 5.0–8.0)

## 2023-09-27 LAB — THYROGLOBULIN LEVEL: Thyroglobulin: <0.1 ng/mL — ABNORMAL LOW

## 2023-09-27 LAB — NO CULTURE INDICATED

## 2023-09-27 LAB — TSH: TSH: 0.29 m[IU]/L — ABNORMAL LOW

## 2023-09-27 LAB — T4, FREE: Free T4: 1.8 ng/dL (ref 0.8–1.8)

## 2023-09-27 LAB — THYROGLOBULIN ANTIBODY: Thyroglobulin Ab: <1 [IU]/mL (ref <=1)

## 2023-10-02 LAB — HEMOGLOBIN A1C
A1c: 8.5
EGFR: 108
Free T4: 13.5 ng/dL
TSH: 0.33 — AB (ref 0.41–5.90)

## 2023-10-04 ENCOUNTER — Ambulatory Visit: Admitting: Internal Medicine

## 2023-10-04 ENCOUNTER — Encounter: Payer: Self-pay | Admitting: Internal Medicine

## 2023-10-04 VITALS — BP 124/70 | HR 107 | Ht 65.0 in | Wt 236.4 lb

## 2023-10-04 DIAGNOSIS — Z7984 Long term (current) use of oral hypoglycemic drugs: Secondary | ICD-10-CM | POA: Diagnosis not present

## 2023-10-04 DIAGNOSIS — E890 Postprocedural hypothyroidism: Secondary | ICD-10-CM | POA: Diagnosis not present

## 2023-10-04 DIAGNOSIS — C73 Malignant neoplasm of thyroid gland: Secondary | ICD-10-CM | POA: Diagnosis not present

## 2023-10-04 DIAGNOSIS — E1165 Type 2 diabetes mellitus with hyperglycemia: Secondary | ICD-10-CM | POA: Diagnosis not present

## 2023-10-04 LAB — POCT GLYCOSYLATED HEMOGLOBIN (HGB A1C): Hemoglobin A1C: 8.3 % — AB (ref 4.0–5.6)

## 2023-10-04 MED ORDER — ACCU-CHEK GUIDE W/DEVICE KIT: PACK | 0 refills | Status: AC

## 2023-10-04 MED ORDER — ACCU-CHEK SOFTCLIX LANCETS MISC: 3 refills | Status: AC

## 2023-10-04 MED ORDER — METFORMIN HCL ER 500 MG PO TB24: 1000.0000 mg | ORAL_TABLET | Freq: Two times a day (BID) | ORAL | 3 refills | Status: AC

## 2023-10-04 MED ORDER — GLUCOSE BLOOD VI STRP: ORAL_STRIP | 3 refills | Status: AC

## 2023-10-04 NOTE — Progress Notes (Signed)
 Patient ID: Jacqueline Calderon, female   DOB: 02-Nov-1975, 48 y.o.   MRN: [id number]  HPI  Jacqueline Calderon is a 48 y.o.-year-old female, initially referred by Dr. 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  Last visit was few days ago.  She returned earlier due to a recently elevated HbA1c and blood glucose level pointing towards a diagnosis of diabetes. She previously saw Dr. Faythe and then Dr. Tommas.  Interim history: No increased urination, increased thirst, blurry vision, unintentional weight loss. She started to change her diet recently, but does continue to have some dietary indiscretions -regular cranberry juice, occasional vodka, popcorn, pizza.  These are not daily occurrences, though.  New diagnosis of diabetes mellitus: At our last visit few days ago she complained of urinary frequency and pressure.  Urinalysis showed 3+ glycosuria. She had further investigation few days later by Dr Mat.  This showed a high HbA1c and glucose.  Patient gives a history of gestational diabetes in 2011.  Around 2017, she was diagnosed with prediabetes by Dr. Tommas.  She was started on metformin , which she continues since.  She takes 1 tablet daily.  She is not checking her blood sugars.  She does not have her glucometer.  Reviewed HbA1c: 10/01/2023: HbA1c 8.5% Previously: 12/21/2015: HbA1c 5.8% No results found for: HGBA1C Previously had GDM in 2011.  Pt is on a regimen of: - Metformin  500 mg 1x a day at lunch - since 2017  Pt's meals are: - Breakfast: fruit - Lunch: may skip, or pizza  - Dinner: Designer, fashion/clothing salad + croutons; meat + veggies + starch; hamburger - Snacks: fruit, prev. Chips Occasional cranberry juice.  - no CKD, last BUN/creatinine:   Lab Results  Component Value Date   BUN 4 (L) 04/24/2010   BUN 5 (L) 04/22/2010   CREATININE 0.47 04/24/2010   CREATININE 0.60 04/22/2010   No results found for:  MICRALBCREAT  - + Dyslipidemia; Last set of lipids:  No results found for: CHOL, HDL, LDLCALC, LDLDIRECT, TRIG, CHOLHDL  - last eye exam was in 2024 - Non- diabetic.  - no numbness and tingling in her feet.  Pt has FH of DM2 in M[facility].SABRA  Reviewed history: Patient describes that she was found to have thyroid  nodules during an exam in her OB/GYN office in 2009.  She was sent for a thyroid  ultrasound, and then a biopsy, which confirmed thyroid  cancer.  She had total thyroidectomy in 2009 and she mentions that she was started on a low-dose of levothyroxine  afterwards.  She started to gain a lot of weight and felt poorly.  She saw [facility] and she was switched to Armour.  The dose was gradually increased over the years.  At our first visit in 06/2022, she described palpitations and tremors and overall signs of thyrotoxicosis at times, but sometimes feeling tired and she also had weight gain.  She saw endocrinology in the past, but not in few years prior to our first appointment.  Refills were per by PCP, but she mentioned that she had pauses in her Armour course at this was not refilled unless she went in person for an appointment.  She was trying to establish care with another PCP.    Dr. Mat recommended that she establish care with me.  Reviewed thyroid  cancer history: - dx in 2009  Total thyroidectomy (01/24/2008-Dr. Gerkin)  THYROID , THYROIDECTOMY:   - PAPILLARY THYROID  CARCINOMA, 0.5 CM.   -  SEE ONCOLOGY TABLE BELOW.   1. Maximum tumor size (cm): 0.5 cm   2. Tumor location: Left upper lobe   3. Multifocality: No   4. Histology: Papillary thyroid  carcinoma, follicular variant   5. Margins: Negative for carcinoma   6. Capsular invasion: N/A   7. Extrathyroidal extension: Not identified   8. Vascular/Lymphatic invasion: Not identified   9. Lymph nodes: # examined 0; # positive 0   10. TNM code: pT1a, pNX, pMX   11. Non-neoplastic thyroid :  Adenomatous nodules (2)   12. Comments: The 0.5 cm nodule in the left upper   thyroid  lobe has features of follicular variant of papillary   thyroid  carcinoma. The remaining two grossly identifiable   nodules are adenomatous nodules.   RAI treatment (06/04/2008): 104.5 mCi  Whole-body scan (06/16/2018): 1.  Residual functioning thyroid  tissue within the thyroid  bed.  2.  No evidence for distant metastatic disease.   Thyroid  ultrasound (10/21/2008): Findings: The right thyroid  lobe, left thyroid  lobe, and isthmus  are surgically absent status post total thyroidectomy.  Imaging of  the thyroid  bed demonstrates a 0.9 x 0.7 x 0.5 cm lymph node at the  expected location of the isthmus.  Right superolateral lymph node measures 2.9 x 1.4 x 0.6 cm.  Left superolateral lymph node measures 2.9 x 1.3 x 0.9 cm.  These lateral lymph nodes as measured above appear to lack central  fatty hila, with cortical thickening.    Other smaller normal appearing lymph nodes are noted bilaterally.    IMPRESSION:  Bilateral abnormal appearing lymph nodes as above; differential  considerations include neoplastic versus infectious/inflammatory  involvement.  Consider ultrasound guided biopsy/aspiration for  further evaluation.   FNA  Left LN (11/05/2008):  SMEARS AND CELL BLOCK: ABUNDANT LYMPHOCYTES. NO METASTATIC   CARCINOMA IDENTIFIED.   Whole-body scan (04/08/2009): No areas of abnormal uptake  Whole-body scan (09/02/2012): No areas of abnormal uptake  Neck ultrasound (12/21/2010): Findings: Small irregular hyperechoic area in the soft tissues just  superior to the isthmus in the midline again visualized and stable  in appearance since the prior study.  This has an appearance  suggestive of scar tissue.   Several small lymph nodes are visualized in the left and right  neck.  Left cervical nodes are significantly less prominent  compared to the prior study with no dominant enlarged lymph node  remaining.   The largest lymph node in the right neck is stable in  size and appearance, measuring roughly 1.6 x 0.5 x 1.2 cm.   IMPRESSION:  No evidence of enlarging lymph nodes or abnormal soft tissue  nodules.  Hyperechoic area in the midline is stable.  Lymph nodes  in the left neck are less prominent.  The largest lymph node in the  right neck is stable.   Neck ultrasound (12/25/2011): Lymphadenopathy: 1 right: 1.6 x 0.4 x 1 cm 2 left: 1.6 x 0.6 x 1 cm and 1.2 x 0.5 x 0.8 cm IMPRESSION:  1. Thyroidectomy without evidence of recurrent disease.  2.  Stable bilateral neck lymph nodes.    Neck ultrasound (08/10/2020): Ultrasound performed of the right posterior neck area of concern. In this region, there are superficial small adjacent hypoechoic nodules with fatty vascular hila measuring 6 x 4 x 7 mm and 8 x 6 x 6 mm. These are small superficial benign-appearing lymph nodes.  IMPRESSION: Right posterior neck palpable abnormality correlates with small superficial benign-appearing lymph nodes.   Neck U/S (07/25/2022): Surgical changes of  total thyroidectomy without evidence of residual or recurrent thyroid  tissue or lymphadenopathy.  Reviewed thyroglobulin and antithyroglobulin antibody levels: Lab Results  Component Value Date   THYROGLB <0.1 (L) 09/25/2023   THYROGLB <0.1 (L) 07/06/2022   Lab Results  Component Value Date   THGAB <1 09/25/2023   THGAB <1 07/06/2022   Pt denies: - feeling nodules in neck - hoarseness - dysphagia - choking  Postsurgical hypothyroidism:  At our first visit, she was on Armour 240 mg daily (equivalent of 400 mcg LT4 daily!). In 06/2022, we decreased the dose to 180 mg daily (equivalent of 300 mcg LT4 daily). In 09/2022, we changed to: 2 tablets of 175 mcg LT4 2 tablets of 5 mcg LT3 daily  for an equivalent to 390 mcg LT4 daily. In 01/2023, we changed to LT4 300 mcg daily In 03/2023, we decreased the dose of LT4 to 250 mcg daily In 09/2023, we  decreased the dose to 225 mcg daily.  She takes this: - in am - on the nightstand - fasting - at least 30 min from b'fast - no calcium - no iron - + multivitamins at lunchtime - no PPIs - not on Biotin - on B12  Reviewed her TFTs: Lab Results  Component Value Date   TSH 0.33 (A) 10/02/2023   TSH 0.29 (L) 09/25/2023   TSH 0.01 (L) 03/28/2023   TSH <0.01 Repeated and verified X2. (L) 01/26/2023   TSH 30.96 (H) 09/25/2022   TSH 1.80 07/06/2022   FREET4 13.5 10/02/2023   FREET4 1.8 09/25/2023   FREET4 2.6 (H) 03/28/2023   FREET4 2.05 (H) 01/26/2023   FREET4 0.32 (L) 09/25/2022   FREET4 0.61 07/06/2022    She has + FH of thyroid  disorders in: maternal uncle. No FH of thyroid  cancer.  No h/o radiation tx to head or neck aside RAI tx.  She has a FH of CHF in mother and Jacqueline Calderon and MGGM.  She has a h/o kidney stones - last 2011 when pregnant.  ROS: + See HPI  Past Medical History:  Diagnosis Date   Thyroid  cancer [facility])    Past Surgical History:  Procedure Laterality Date   BREAST EXCISIONAL BIOPSY Left    CESAREAN SECTION  04/21/2010   THYROID  SURGERY     total thyroidectomy - Dr Eletha 01/24/2008   Social History   Socioeconomic History   Marital status: Married    Spouse name: Not on file   Number of children: 1   Years of education: Not on file   Highest education level: Not on file  Occupational History   Occupation: Teacher, English as a foreign language  Tobacco Use   Smoking status: Former    Current packs/day: 0.00    Types: Cigarettes    Quit date: 2011    Years since quitting: 14.5   Smokeless tobacco: Not on file  Substance and Sexual Activity   Alcohol use: Yes    Comment: 2-3x a week   Drug use: Not on file   Sexual activity: Not on file  Other Topics Concern   Not on file  Social History Narrative   Not on file   Social Drivers of Health   Financial Resource Strain: Not on file  Food Insecurity: Not on file  Transportation Needs: Not on file   Physical Activity: Not on file  Stress: Not on file  Social Connections: Unknown (07/31/2021)   [facility]   Social Network    Social Network: Not on file  Intimate Partner Violence: Unknown (06/24/2021)   Received from [facility]   HITS    Physically Hurt: Not on file    Insult or Talk Down To: Not on file    Threaten Physical Harm: Not on file    Scream or Curse: Not on file   Current Outpatient Medications on File Prior to Visit  Medication Sig Dispense Refill   chlorthalidone (HYGROTON) 25 MG tablet Take 1 tablet by mouth daily.     ketoconazole (NIZORAL) 2 % cream APPLY TO THE AFFECTED AREA(S) BY TOPICAL ROUTE ONCE DAILY     levothyroxine  (SYNTHROID ) 200 MCG tablet Take 1 tablet (200 mcg total) by mouth daily. 45 tablet 3   levothyroxine  (SYNTHROID ) 25 MCG tablet Take 2 tablets (50 mcg total) by mouth daily. 45 tablet 3   losartan (COZAAR) 50 MG tablet Take 1 tablet by mouth daily.     metFORMIN  (GLUCOPHAGE -XR) 500 MG 24 hr tablet TAKE 1 TABLET BY MOUTH EVERY DAY WITH EVENING MEAL     rosuvastatin (CRESTOR) 10 MG tablet Take 1 tablet by mouth daily.     No current facility-administered medications on file prior to visit.   No Known Allergies Family History  Problem Relation Age of Onset   Congestive Heart Failure Mother    Congestive Heart Failure Maternal Grandfather   + Also, grandmother with diabetes, HTN, HL. + Grandfather with prostate cancer  PE: BP 124/70   Pulse (!) 107   Ht 5' 5 (1.651 m)   Wt 236 lb 6.4 oz (107.2 kg)   SpO2 97%   BMI 39.34 kg/m  Wt Readings from Last 3 Encounters:  10/04/23 236 lb 6.4 oz (107.2 kg)  09/25/23 236 lb 12.8 oz (107.4 kg)  03/28/23 232 lb 12.8 oz (105.6 kg)   Constitutional: Overweight, in NAD Eyes:  EOMI, no exophthalmos ENT: no neck masses palpated, thyroid  scar inconspicuous, no cervical lymphadenopathy Cardiovascular: tachycardia, RR, No MRG Respiratory: CTA B Musculoskeletal: no  deformities Skin:no rashes Neurological: + Faint tremor with outstretched hands Diabetic Foot Exam - Simple   Simple Foot Form Diabetic Foot exam was performed with the following findings: Yes 10/04/2023  2:16 PM  Visual Inspection No deformities, no ulcerations, no other skin breakdown bilaterally: Yes Sensation Testing Intact to touch and monofilament testing bilaterally: Yes Pulse Check Posterior Tibialis and Dorsalis pulse intact bilaterally: Yes Comments     ASSESSMENT: 1. DM2 -newly diagnosed  2. Thyroid  cancer - see HPI  3. Postsurgical Hypothyroidism  PLAN:  1.  DM2 - new dx - At last visit, 9 days ago, she complained about increased urinary frequency and pressure, without dysuria.  We checked her urinalysis which did not show a UTI, but it showed 3+ glucose.  Patient was not a known diabetic.  She had a CMP obtained in Dr. Glennette office few days later and that showed a glucose of 190 and an HbA1c of 8.5%.  At today's visit, we repeated her HbA1c to confirm the diagnosis of diabetes and this again returned elevated, at 8.3%.  She does give her history of gestational diabetes and then prediabetes and she was on the low-dose metformin  for the last approximately 8 years. - we did discuss about diabetes type 2 as a disorder associated with insulin resistance, reviewed possible complications, glycemic targets, and treatment options. - She is not checking blood sugars at home and I advised her to start checking once a day, rotating check times.  Fasting blood sugars between 80s and 130  are ideal, along with postprandial blood sugars less than 180.  I sent a prescription for glucometer to her pharmacy. - Will also discussed about her diet, which she already started to improve, and I suggested further changes.  I also advised her to absolutely avoid sweet drinks including regular sodas, juice, sweet tea. She declines a referral to nutrition for now. - For now, we will increase her  metformin  dose as tolerated.  I would like to see the patient back in a month and a half to review her blood sugars and see if we need to escalate the regimen.  I also plan to check her insulin production and her antipancreatic antibodies to screen her for type 1 diabetes. - advised for yearly eye exams >> she is UTD with a regular eye exam, but this was not a diabetic eye exam. -On recent  labs, she also had transaminitis, which we will need to recheck after patient's diabetes improved.  She will probably need to have further investigation for the transaminitis.  She does mention a diagnosis of fatty liver.  For now, I advised her to try to avoid alcohol - return to clinic in 1.5 months  2. Papillary thyroid  cancer -follicular variant -Patient with stage I TNM thyroid  cancer as the tumor was small, subcentimeter, unifocal, and with clear margins, and also without lymphovascular invasion.  We discussed that this confers a good prognosis, although the follicular variant of PTC has a slightly higher risk of vascular invasion. -She had total thyroidectomy and then RAI treatment in 2009.  She had 3 whole-body scans with the latest being in 2014, which showed no abnormal sites of uptake.  She also had several imaging tests and 1 biopsy to investigate enlarged lymph nodes (benign).  The lymph nodes were less conspicuous over time except for posterior right lymph node which appeared to be benign (with a fatty hilum) in 2022. Latest neck ultrasound from 07/2022 did not show any signs of recurrence or abnormal neck masses. - Thyroglobulin levels checked in 07/2022 were undetectable.  I did not find thyroglobulin levels checked previously for her. - At last visit, we checked her thyroglobulin and ATA antibodies and these were undetectable (09/25/2023).  Will recheck this in a year.  3.  Patient with h/o total thyroidectomy for cancer, now with uncontrolled iatrogenic hypothyroidism, on desiccated thyroid   extract -She was initially started on levothyroxine , but she felt poorly on it and she started to see an integrative medicine provider, who started her on Armour.  When I first saw her, she was on an extremely high dose of Armour, 240 mg daily, the equivalent of 400 mcg of levothyroxine  and she felt hyperthyroid on this.  She actually stopped Armour for few days and felt better off it, but then started to feel poorly again. - After discussion about the negative consequences of over replacement with thyroid  hormones we started to decrease her Armour dose and then switched to levothyroxine .  She actually felt much better on LT4 compared to Armour. - 2 days ago, a TSH returned slightly suppressed: Lab Results  Component Value Date   TSH 0.33 (A) 10/02/2023  - we decreased her LT4 dose from 250 to 225 mcg daily - she is  taking the thyroid  hormone every day, with water, >30 minutes before breakfast, separated by >4 hours from acid reflux medications, calcium, iron, multivitamins. Pt. is taking it correctly. - will check thyroid  tests in 1.5 months, at next visit  - Total time spent  for the visit: 40 min, in precharting, postcharting, obtaining medical information from the chart and from the pt, reviewing her  previous labs, evaluations, and treatments, reviewing her symptoms, counseling her about her diabetes (please see the discussed topics above), and developing a plan to further treat it; she had a number of questions which I addressed   Lela Fendt, MD PhD [facility]

## 2023-10-04 NOTE — Patient Instructions (Signed)
 Please increase: - Metformin  to 1000 mg 2x a day with meals  Please return in 1.5 months with your sugar log.   PATIENT INSTRUCTIONS FOR TYPE 2 DIABETES:  DIET AND EXERCISE Diet and exercise is an important part of diabetic treatment.  We recommended aerobic exercise in the form of brisk walking (working between 40-60% of maximal aerobic capacity, similar to brisk walking) for 150 minutes per week (such as 30 minutes five days per week) along with 3 times per week performing 'resistance' training (using various gauge rubber tubes with handles) 5-10 exercises involving the major muscle groups (upper body, lower body and core) performing 10-15 repetitions (or near fatigue) each exercise. Start at half the above goal but build slowly to reach the above goals. If limited by weight, joint pain, or disability, we recommend daily walking in a swimming pool with water up to waist to reduce pressure from joints while allow for adequate exercise.    BLOOD GLUCOSES Monitoring your blood glucoses is important for continued management of your diabetes. Please check your blood glucoses 2-4 times a day: fasting, before meals and at bedtime (you can rotate these measurements - e.g. one day check before the 3 meals, the next day check before 2 of the meals and before bedtime, etc.).   HYPOGLYCEMIA (low blood sugar) Hypoglycemia is usually a reaction to not eating, exercising, or taking too much insulin/ other diabetes drugs.  Symptoms include tremors, sweating, hunger, confusion, headache, etc. Treat IMMEDIATELY with 15 grams of Carbs: 4 glucose tablets  cup regular juice/soda 2 tablespoons raisins 4 teaspoons sugar 1 tablespoon honey Recheck blood glucose in 15 mins and repeat above if still symptomatic/blood glucose <100.  RECOMMENDATIONS TO REDUCE YOUR RISK OF DIABETIC COMPLICATIONS: * Take your prescribed MEDICATION(S) * Follow a DIABETIC diet: Complex carbs, fiber rich foods, (monounsaturated and  polyunsaturated) fats * AVOID saturated/trans fats, high fat foods, >2,300 mg salt per day. * EXERCISE at least 5 times a week for 30 minutes or preferably daily.  * DO NOT SMOKE OR DRINK more than 1 drink a day. * Check your FEET every day. Do not wear tightfitting shoes. Contact us  if you develop an ulcer * See your EYE doctor once a year or more if needed * Get a FLU shot once a year * Get a PNEUMONIA vaccine once before and once after age 48 years  GOALS:  * Your Hemoglobin A1c of <7%  * fasting sugars need to be 80-130 * after meals sugars need to be <180 (2h after you start eating) * Your Systolic BP should be 130 or lower  * Your Diastolic BP should be 80 or lower  * Your HDL (Good Cholesterol) should be 40 or higher  * Your LDL (Bad Cholesterol) should be ideally <70. * Your Triglycerides should be 150 or lower  * Your Urine microalbumin (kidney function) should be <30 * Your Body Mass Index should be 25 or lower   Please consider the following ways to cut down carbs and fat and increase fiber and micronutrients in your diet: - substitute whole grain for white bread or pasta - substitute brown rice for white rice - substitute 90-calorie flat bread pieces for slices of bread when possible - substitute sweet potatoes or yams for white potatoes - substitute humus for margarine - substitute tofu for cheese when possible - substitute almond or rice milk for regular milk (would not drink soy milk daily due to concern for soy estrogen influence on breast  cancer risk) - substitute dark chocolate for other sweets when possible - substitute water - can add lemon or orange slices for taste - for diet sodas (artificial sweeteners will trick your body that you can eat sweets without getting calories and will lead you to overeating and weight gain in the long run) - do not skip breakfast or other meals (this will slow down the metabolism and will result in more weight gain over time)  - can  try smoothies made from fruit and almond/rice milk in am instead of regular breakfast - can also try old-fashioned (not instant) oatmeal made with almond/rice milk in am - order the dressing on the side when eating salad at a restaurant (pour less than half of the dressing on the salad) - eat as little meat as possible - can try juicing, but should not forget that juicing will get rid of the fiber, so would alternate with eating raw veg./fruits or drinking smoothies - use as little oil as possible, even when using olive oil - can dress a salad with a mix of balsamic vinegar and lemon juice, for e.g. - use agave nectar, stevia sugar, or regular sugar rather than artificial sweateners - steam or broil/roast veggies  - snack on veggies/fruit/nuts (unsalted, preferably) when possible, rather than processed foods - reduce or eliminate aspartame in diet (it is in diet sodas, chewing gum, etc) Read the labels!  Try to read Dr. Rosalynn Points book: Program for Reversing Diabetes for other ideas for healthy eating.

## 2023-11-14 ENCOUNTER — Encounter: Payer: Self-pay | Admitting: Internal Medicine

## 2023-11-14 DIAGNOSIS — E890 Postprocedural hypothyroidism: Secondary | ICD-10-CM

## 2023-11-14 MED ORDER — LEVOTHYROXINE SODIUM 25 MCG PO TABS
50.0000 ug | ORAL_TABLET | Freq: Every day | ORAL | 3 refills | Status: DC
Start: 2023-11-14 — End: 2023-11-16

## 2023-11-15 ENCOUNTER — Encounter: Payer: Self-pay | Admitting: Internal Medicine

## 2023-11-15 ENCOUNTER — Ambulatory Visit (INDEPENDENT_AMBULATORY_CARE_PROVIDER_SITE_OTHER): Admitting: Internal Medicine

## 2023-11-15 VITALS — BP 122/70 | HR 101 | Ht 65.0 in | Wt 224.4 lb

## 2023-11-15 DIAGNOSIS — C73 Malignant neoplasm of thyroid gland: Secondary | ICD-10-CM | POA: Diagnosis not present

## 2023-11-15 DIAGNOSIS — E890 Postprocedural hypothyroidism: Secondary | ICD-10-CM | POA: Diagnosis not present

## 2023-11-15 DIAGNOSIS — E1165 Type 2 diabetes mellitus with hyperglycemia: Secondary | ICD-10-CM

## 2023-11-15 LAB — T4, FREE: Free T4: 2.5 ng/dL — ABNORMAL HIGH (ref 0.8–1.8)

## 2023-11-15 LAB — TSH: TSH: 0.05 m[IU]/L — ABNORMAL LOW

## 2023-11-15 NOTE — Progress Notes (Signed)
 Patient ID: Jacqueline Calderon, female   DOB: Mar 13, 1976, 48 y.o.   MRN: [id number]  HPI  Jacqueline Calderon is a 48 y.o.-year-old female-year-old female, initially referred by Dr. 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 She previously saw Dr. Faythe and then Dr. Tommas.  Interim history: No increased urination, increased thirst, blurry vision, unintentional weight loss.,  She was able to lose approximately 12 pounds since last visit through changes in diet.   DM2: In 09/2023, she complained of urinary frequency and pressure.  Urinalysis showed 3+ glycosuria. She had further investigation few days later by Dr Mat.  This showed a high HbA1c and glucose.  High HbA1c was confirmed when she returns to the clinic few days later.  Patient gives a history of gestational diabetes in 2011.  Around 2017, she was diagnosed with prediabetes by Dr. Tommas.  She was started on metformin , which she continued since.   Reviewed HbA1c: Lab Results  Component Value Date   HGBA1C 8.3 (A) 10/04/2023  10/01/2023: HbA1c 8.5% 12/21/2015: HbA1c 5.8%  Previously had GDM in 2011.  Pt is on a regimen of: - Metformin  500 mg 1x a day at lunch - since 2017 >> increase to 1000 mg 2x a day (L and D)  She checks CBGs 1x a day: - am: 110-141 - 2h after b'fast: 124 - lunch: n/c - 2h after lunch: 140s-152 - dinner: n/c - 2h after dinner: 140-150s, 181 - bedtime: Lowest: 110. Highest: 264 (grapes).  - no CKD, last BUN/creatinine:   Lab Results  Component Value Date   BUN 4 (L) 04/24/2010   BUN 5 (L) 04/22/2010   CREATININE 0.47 04/24/2010   CREATININE 0.60 04/22/2010   No results found for: MICRALBCREAT  - + Dyslipidemia; Last set of lipids:  No results found for: CHOL, HDL, LDLCALC, LDLDIRECT, TRIG, CHOLHDL  - last eye exam was in 2024 - Non- diabetic.  - no numbness and  tingling in her feet.  Last foot exam 10/04/2023.  Pt has FH of DM2 in M[facility].  Reviewed history: Patient describes that she was found to have thyroid  nodules during an exam in her OB/GYN office in 2009.  She was sent for a thyroid  ultrasound, and then a biopsy, which confirmed thyroid  cancer.  She had total thyroidectomy in 2009 and she mentions that she was started on a low-dose of levothyroxine  afterwards.  She started to gain a lot of weight and felt poorly.  She saw [facility] and she was switched to Armour.  The dose was gradually increased over the years.  At our first visit in 06/2022, she described palpitations and tremors and overall signs of thyrotoxicosis at times, but sometimes feeling tired and she also had weight gain.  She saw endocrinology in the past, but not in few years prior to our first appointment.  Refills were per by PCP, but she mentioned that she had pauses in her Armour course at this was not refilled unless she went in person for an appointment.  She was trying to establish care with another PCP.    Dr. Mat recommended to establish care with me.  Reviewed thyroid  cancer history: - dx in 2009  Total thyroidectomy (01/24/2008-Dr. Gerkin)  THYROID , THYROIDECTOMY:   - PAPILLARY THYROID  CARCINOMA, 0.5 CM.   - SEE ONCOLOGY TABLE BELOW.   1. Maximum tumor size (cm): 0.5 cm   2. Tumor location: Left upper  lobe   3. Multifocality: No   4. Histology: Papillary thyroid  carcinoma, follicular variant   5. Margins: Negative for carcinoma   6. Capsular invasion: N/A   7. Extrathyroidal extension: Not identified   8. Vascular/Lymphatic invasion: Not identified   9. Lymph nodes: # examined 0; # positive 0   10. TNM code: pT1a, pNX, pMX   11. Non-neoplastic thyroid : Adenomatous nodules (2)   12. Comments: The 0.5 cm nodule in the left upper   thyroid  lobe has features of follicular variant of papillary   thyroid  carcinoma. The remaining two grossly  identifiable   nodules are adenomatous nodules.   RAI treatment (06/04/2008): 104.5 mCi  Whole-body scan (06/16/2018): 1.  Residual functioning thyroid  tissue within the thyroid  bed.  2.  No evidence for distant metastatic disease.   Thyroid  ultrasound (10/21/2008): Findings: The right thyroid  lobe, left thyroid  lobe, and isthmus  are surgically absent status post total thyroidectomy.  Imaging of  the thyroid  bed demonstrates a 0.9 x 0.7 x 0.5 cm lymph node at the  expected location of the isthmus.  Right superolateral lymph node measures 2.9 x 1.4 x 0.6 cm.  Left superolateral lymph node measures 2.9 x 1.3 x 0.9 cm.  These lateral lymph nodes as measured above appear to lack central  fatty hila, with cortical thickening.    Other smaller normal appearing lymph nodes are noted bilaterally.    IMPRESSION:  Bilateral abnormal appearing lymph nodes as above; differential  considerations include neoplastic versus infectious/inflammatory  involvement.  Consider ultrasound guided biopsy/aspiration for  further evaluation.   FNA  Left LN (11/05/2008):  SMEARS AND CELL BLOCK: ABUNDANT LYMPHOCYTES. NO METASTATIC   CARCINOMA IDENTIFIED.   Whole-body scan (04/08/2009): No areas of abnormal uptake  Whole-body scan (09/02/2012): No areas of abnormal uptake  Neck ultrasound (12/21/2010): Findings: Small irregular hyperechoic area in the soft tissues just  superior to the isthmus in the midline again visualized and stable  in appearance since the prior study.  This has an appearance  suggestive of scar tissue.   Several small lymph nodes are visualized in the left and right  neck.  Left cervical nodes are significantly less prominent  compared to the prior study with no dominant enlarged lymph node  remaining.  The largest lymph node in the right neck is stable in  size and appearance, measuring roughly 1.6 x 0.5 x 1.2 cm.   IMPRESSION:  No evidence of enlarging lymph nodes or abnormal soft  tissue  nodules.  Hyperechoic area in the midline is stable.  Lymph nodes  in the left neck are less prominent.  The largest lymph node in the  right neck is stable.   Neck ultrasound (12/25/2011): Lymphadenopathy: 1 right: 1.6 x 0.4 x 1 cm 2 left: 1.6 x 0.6 x 1 cm and 1.2 x 0.5 x 0.8 cm IMPRESSION:  1. Thyroidectomy without evidence of recurrent disease.  2.  Stable bilateral neck lymph nodes.    Neck ultrasound (08/10/2020): Ultrasound performed of the right posterior neck area of concern. In this region, there are superficial small adjacent hypoechoic nodules with fatty vascular hila measuring 6 x 4 x 7 mm and 8 x 6 x 6 mm. These are small superficial benign-appearing lymph nodes.  IMPRESSION: Right posterior neck palpable abnormality correlates with small superficial benign-appearing lymph nodes.   Neck U/S (07/25/2022): Surgical changes of total thyroidectomy without evidence of residual or recurrent thyroid  tissue or lymphadenopathy.  Reviewed thyroglobulin and antithyroglobulin antibody levels: Lab  Results  Component Value Date   THYROGLB <0.1 (L) 09/25/2023   THYROGLB <0.1 (L) 07/06/2022   Lab Results  Component Value Date   THGAB <1 09/25/2023   THGAB <1 07/06/2022   Pt denies: - feeling nodules in neck - hoarseness - dysphagia - choking  Postsurgical hypothyroidism:  At our first visit, she was on Armour 240 mg daily (equivalent of 400 mcg LT4 daily!). In 06/2022, we decreased the dose to 180 mg daily (equivalent of 300 mcg LT4 daily). In 09/2022, we changed to: 2 tablets of 175 mcg LT4 2 tablets of 5 mcg LT3 daily  for an equivalent to 390 mcg LT4 daily. In 01/2023, we changed to LT4 300 mcg daily In 03/2023, we decreased the dose of LT4 to 250 mcg daily In 09/2023, we decreased the dose to 225 mcg daily.  She takes this: - in am - on the nightstand - fasting - at least 30 min from b'fast - no calcium - no iron - + multivitamins at lunchtime -  no PPIs - not on Biotin - on B12  Reviewed her TFTs: Lab Results  Component Value Date   TSH 0.33 (A) 10/02/2023   TSH 0.29 (L) 09/25/2023   TSH 0.01 (L) 03/28/2023   TSH <0.01 Repeated and verified X2. (L) 01/26/2023   TSH 30.96 (H) 09/25/2022   TSH 1.80 07/06/2022   FREET4 13.5 10/02/2023   FREET4 1.8 09/25/2023   FREET4 2.6 (H) 03/28/2023   FREET4 2.05 (H) 01/26/2023   FREET4 0.32 (L) 09/25/2022   FREET4 0.61 07/06/2022    She has + FH of thyroid  disorders in: maternal uncle. No FH of thyroid  cancer.  No h/o radiation tx to head or neck aside RAI tx.  She has a FH of CHF in mother and CHRISTELLA [place] and MGGM.  She has a h/o kidney stones - last 2011 when pregnant.  ROS: + See HPI  Past Medical History:  Diagnosis Date   Thyroid  cancer [facility])    Past Surgical History:  Procedure Laterality Date   BREAST EXCISIONAL BIOPSY Left    CESAREAN SECTION  04/21/2010   THYROID  SURGERY     total thyroidectomy - Dr Eletha 01/24/2008   Social History   Socioeconomic History   Marital status: Married    Spouse name: Not on file   Number of children: 1   Years of education: Not on file   Highest education level: Not on file  Occupational History   Occupation: Teacher, English as a foreign language  Tobacco Use   Smoking status: Former    Current packs/day: 0.00    Types: Cigarettes    Quit date: 2011    Years since quitting: 14.6   Smokeless tobacco: Not on file  Substance and Sexual Activity   Alcohol use: Yes    Comment: 2-3x a week   Drug use: Not on file   Sexual activity: Not on file  Other Topics Concern   Not on file  Social History Narrative   Not on file   Social Drivers of Health   Financial Resource Strain: Not on file  Food Insecurity: Not on file  Transportation Needs: Not on file  Physical Activity: Not on file  Stress: Not on file  Social Connections: Unknown (07/31/2021)   Received from [facility]   Social Network    Social Network: Not on file  Intimate  Partner Violence: Unknown (06/24/2021)   Received from [facility]   HITS    Physically Hurt: Not  on file    Insult or Talk Down To: Not on file    Threaten Physical Harm: Not on file    Scream or Curse: Not on file   Current Outpatient Medications on File Prior to Visit  Medication Sig Dispense Refill   Accu-Chek Softclix Lancets lancets Use as instructed 1x a day 100 each 3   Blood Glucose Monitoring Suppl (ACCU-CHEK GUIDE) w/Device KIT Use as advised 1 kit 0   chlorthalidone (HYGROTON) 25 MG tablet Take 1 tablet by mouth daily.     glucose blood test strip Use as instructed 1x a day - AccuChek Guide 100 each 3   ketoconazole (NIZORAL) 2 % cream APPLY TO THE AFFECTED AREA(S) BY TOPICAL ROUTE ONCE DAILY     levothyroxine  (SYNTHROID ) 200 MCG tablet Take 1 tablet (200 mcg total) by mouth daily. 45 tablet 3   levothyroxine  (SYNTHROID ) 25 MCG tablet Take 2 tablets (50 mcg total) by mouth daily. 45 tablet 3   losartan (COZAAR) 50 MG tablet Take 1 tablet by mouth daily.     metFORMIN  (GLUCOPHAGE -XR) 500 MG 24 hr tablet Take 2 tablets (1,000 mg total) by mouth 2 (two) times daily with a meal. 360 tablet 3   rosuvastatin (CRESTOR) 10 MG tablet Take 1 tablet by mouth daily.     No current facility-administered medications on file prior to visit.   No Known Allergies Family History  Problem Relation Age of Onset   Congestive Heart Failure Mother    Congestive Heart Failure Maternal Grandfather   + Also, grandmother with diabetes, HTN, HL. + Grandfather with prostate cancer  PE: BP 122/70   Pulse (!) 101   Ht 5' 5 (1.651 m)   Wt 224 lb 6.4 oz (101.8 kg)   SpO2 99%   BMI 37.34 kg/m  Wt Readings from Last 3 Encounters:  11/15/23 224 lb 6.4 oz (101.8 kg)  10/04/23 236 lb 6.4 oz (107.2 kg)  09/25/23 236 lb 12.8 oz (107.4 kg)   Constitutional: Overweight, in NAD Eyes:  EOMI, no exophthalmos ENT: no neck masses palpated, thyroid  scar inconspicuous, no cervical  lymphadenopathy Cardiovascular: tachycardia, RR, No MRG Respiratory: CTA B Musculoskeletal: no deformities Skin:no rashes Neurological: + Faint tremor with outstretched hands  ASSESSMENT: 1. DM2 non-insulin-dependent, without long-term complications, but with hyperglycemia  2. Thyroid  cancer - see HPI  3. Postsurgical Hypothyroidism  PLAN:  1.  DM2  -Recent diagnosis, from last month. -At our visit from 09/25/2023, she complained about increased urinary frequency and pressure, without dysuria.  We checked her urinalysis which did not show a UTI, but it showed 3+ glucose.  Patient was not a known diabetic.  She had a CMP obtained in Dr. Glennette office few days later and that showed a glucose of 190 and an HbA1c of 8.5%.  We then repeated her HbA1c to confirm the diagnosis of diabetes and this again returned elevated, at 8.3%.  She does have a history of gestational diabetes and then prediabetes and she was on the low-dose metformin  for the last approximately 8 years.  At last visit, we discussed about DM 2 as a disorder associated with insulin resistance, reviewed possible complications, glycemic targets, and treatment options.  She was not checking blood sugars at home and I advised her to start checking once a day, rotating check times.  We also discussed about improving diet, which she already started to do.  I advised her that she absolutely needed to avoid sweet drinks including regular sodas, juice,  sweet tea.  She declined a referral to nutrition at that time. -At last visit, I recommended to increase the metformin  dose as tolerated. -At today's visit, the majority of the blood sugars are at goal, and she is doing a great job with her diet.  I congratulated her for this.  She would also be interested in seeing a nutritionist and I put the referral in today.  She is still occasionally having more concentrated sweets (for example grapes - she did not realize that they can increase her blood  sugars), and we discussed about looking up glycemic index and glycemic load.  Otherwise, I did not recommend a change in regimen. - She does appear to have type 2 diabetes but in the near future, we need to check her insulin production and antipancreatic antibodies to screen her for type 1 diabetes - Continue to check sugars at different times of the day - 1x a day, rotating check times - advised for yearly eye exams >> she is UTD - return to clinic in 3-4 months  2. Papillary thyroid  cancer -follicular variant -Patient with stage I TNM thyroid  cancer as the tumor was small, subcentimeter, unifocal, and with clear margins and also without lymphovascular invasion.  We discussed about the fact that this is a good prognosis, although the follicular variant of PTC has a slightly higher risk of vascular invasion. - She had total thyroidectomy and then RAI treatment in 2009.  She had 3 whole-body scans with the latest being in 2014, which showed no abnormal sites of uptake.  She also had several imaging tests and 1 biopsy to investigate enlarged lymph nodes (benign).  The lymph nodes were less conspicuous over time except for posterior right lymph node which appeared to be benign (with a fatty hilum) in 2022.  Latest neck ultrasound from 07/2022 did not show any signs of recurrence or abnormal neck masses. - Thyroglobulin levels checked in 07/2022 were undetectable.  I did not find thyroglobulin levels checked previously for her - Her thyroglobulin and ATA antibodies were undetectable last month.  Will recheck these in a year from the previous.  3.  Patient history of total thyroidectomy for thyroid  cancer, now with uncontrolled iatrogenic hypothyroidism previously on desiccated thyroid  extract but now on LT4.  She actually feels much better on LT4 compared to Armour. - latest thyroid  labs reviewed with pt. >> TSH was slightly suppressed so we decreased her LT4 dose from 250 to 225 mcg daily: Lab Results   Component Value Date   TSH 0.33 (A) 10/02/2023  - she continues on LT4 225 mcg daily - pt feels good on this dose. - we discussed about taking the thyroid  hormone every day, with water, >30 minutes before breakfast, separated by >4 hours from acid reflux medications, calcium, iron, multivitamins. Pt. is taking it correctly. - will check thyroid  tests today: TSH and fT4 - If labs are abnormal, she will need to return for repeat TFTs in 1.5 months  Orders Placed This Encounter  Procedures   TSH   T4, free   Microalbumin / creatinine urine ratio   Amb ref to Medical Nutrition Therapy-MNT   Lela Fendt, MD PhD [facility] Endocrinology

## 2023-11-15 NOTE — Patient Instructions (Addendum)
 Please continue: - Metformin  1000 mg 2x a day with meals  Please continue levothyroxine  225 mcg daily.  Take the thyroid  hormone every day, with water, at least 30 minutes before breakfast, separated by at least 4 hours from: - acid reflux medications - calcium - iron - multivitamins  Please stop at the lab.  Look up Glycemic index and glycemic index.  Please return in 3 months with your sugar log.

## 2023-11-16 ENCOUNTER — Ambulatory Visit: Payer: Self-pay | Admitting: Internal Medicine

## 2023-11-16 LAB — MICROALBUMIN / CREATININE URINE RATIO
Creatinine, Urine: 201 mg/dL (ref 20–275)
Microalb Creat Ratio: 5 mg/g{creat} (ref <30)
Microalb, Ur: 1 mg/dL

## 2023-11-16 NOTE — Addendum Note (Signed)
 Addended by: TRIXIE FILE on: 11/16/2023 02:19 PM   Modules accepted: Orders

## 2023-11-27 ENCOUNTER — Encounter: Payer: Self-pay | Admitting: Physician Assistant

## 2024-01-20 NOTE — Progress Notes (Deleted)
 Ellouise Console, PA-C [address] [place], [place]  [address] Phone: [phone]   Gastroenterology Consultation  Referring Provider:     Mat Browning, MD Primary Care Physician:  Mat Browning, MD Primary Gastroenterologist:  Ellouise Console, PA-C / *** Reason for Consultation:     Elevated LFTs        HPI:   Discussed the use of AI scribe software for clinical note transcription with the patient, who gave verbal consent to proceed.  10/01/2023 labs: Elevated liver transaminases with AST 73, ALT 74.  Normal alk phos 103, T. bili 0.4.  History of Present Illness   06/2022 Cologuard negative.  PMH: Follicular papillary thyroid  cancer (2009), post thyroidectomy hypothyroidism (2009), type 2 diabetes.  Followed by endocrinologist Dr. Coral.  Past Medical History:  Diagnosis Date   Thyroid  cancer [facility])     Past Surgical History:  Procedure Laterality Date   BREAST EXCISIONAL BIOPSY Left    CESAREAN SECTION  04/21/2010   THYROID  SURGERY     total thyroidectomy - Dr Eletha 01/24/2008    Prior to Admission medications   Medication Sig Start Date End Date Taking? Authorizing Provider  Accu-Chek Softclix Lancets lancets Use as instructed 1x a day 10/04/23   Trixie File, MD  Blood Glucose Monitoring Suppl (ACCU-CHEK GUIDE) w/Device KIT Use as advised 10/04/23   Trixie File, MD  chlorthalidone (HYGROTON) 25 MG tablet Take 1 tablet by mouth daily.    [provider]  glucose blood test strip Use as instructed 1x a day - AccuChek Guide 10/04/23   Trixie File, MD  ketoconazole (NIZORAL) 2 % cream APPLY TO THE AFFECTED AREA(S) BY TOPICAL ROUTE ONCE DAILY    [provider]  levothyroxine  (SYNTHROID ) 200 MCG tablet Take 1 tablet (200 mcg total) by mouth daily. 09/26/23   Trixie File, MD  losartan (COZAAR) 50 MG tablet Take 1 tablet by mouth daily.    [provider]  metFORMIN  (GLUCOPHAGE -XR) 500 MG 24 hr tablet Take 2 tablets  (1,000 mg total) by mouth 2 (two) times daily with a meal. 10/04/23   Trixie File, MD  rosuvastatin (CRESTOR) 10 MG tablet Take 1 tablet by mouth daily.    [provider]    Family History  Problem Relation Age of Onset   Congestive Heart Failure Mother    Congestive Heart Failure Maternal Grandfather      Social History   Tobacco Use   Smoking status: Former    Current packs/day: 0.00    Types: Cigarettes    Quit date: 2011    Years since quitting: 14.8  Substance Use Topics   Alcohol use: Yes    Comment: 2-3x a week    Allergies as of 01/21/2024   (No Known Allergies)    Review of Systems:    All systems reviewed and negative except where noted in HPI.   Physical Exam:  There were no vitals taken for this visit. No LMP recorded.  General:   Alert,  Well-developed, well-nourished, pleasant and cooperative in NAD Lungs:  Respirations even and unlabored.  Clear throughout to auscultation.   No wheezes, crackles, or rhonchi. No acute distress. Heart:  Regular rate and rhythm; no murmurs, clicks, rubs, or gallops. Abdomen:  Normal bowel sounds.  No bruits.  Soft, and non-distended without masses, hepatosplenomegaly or hernias noted.  No Tenderness.  No guarding or rebound tenderness.    Neurologic:  Alert and oriented x3;  grossly normal neurologically. Psych:  Alert and cooperative. Normal mood and affect.   Imaging Studies: No results found.  Labs: CBC    Component Value Date/Time   WBC 8.2 04/24/2010 0526   RBC 2.98 (L) 04/24/2010 0526   HGB 7.5 (L) 04/24/2010 0526   HCT 24.1 (L) 04/24/2010 0526   PLT 154 04/24/2010 0526   MCV 80.9 04/24/2010 0526    CMP     Component Value Date/Time   NA 141 04/24/2010 0526   K 2.9 (L) 04/24/2010 0526   CL 108 04/24/2010 0526   CO2 26 04/24/2010 0526   GLUCOSE 87 04/24/2010 0526   BUN 4 (L) 04/24/2010 0526   CREATININE 0.47 04/24/2010 0526   CALCIUM 8.0 (L) 04/24/2010 0526   PROT 4.8 (L) 04/24/2010  0526   ALBUMIN 2.2 (L) 04/24/2010 0526   AST 25 04/24/2010 0526   ALT 32 04/24/2010 0526   ALKPHOS 104 04/24/2010 0526   BILITOT 0.4 04/24/2010 0526   GFRNONAA >60 04/24/2010 0526   GFRAA  04/24/2010 0526    >60        The eGFR has been calculated using the MDRD equation. This calculation has not been validated in all clinical situations. eGFR's persistently <60 mL/min signify possible Chronic Kidney Disease.    Assessment and Plan:   Jacqueline Calderon is a 48 y.o. y/o female has been referred for   1.  Elevated elevated liver transaminases - Labs: ANA, AMA, ASMA, ceruloplasmin, viral hepatitis A/B/C, iron panel, ferritin, immunoglobulins, alpha-1 antitrypsin  - RUQ abdominal ultrasound - Avoid alcohol - Avoid OTC herbal supplements   Assessment & Plan       Follow up ***  Ellouise Console, PA-C

## 2024-01-21 ENCOUNTER — Ambulatory Visit: Admitting: Physician Assistant

## 2024-02-19 ENCOUNTER — Encounter: Payer: Self-pay | Admitting: Internal Medicine

## 2024-02-19 ENCOUNTER — Other Ambulatory Visit

## 2024-02-19 ENCOUNTER — Ambulatory Visit: Admitting: Internal Medicine

## 2024-02-19 VITALS — BP 122/84 | HR 82 | Resp 18 | Ht 65.0 in | Wt 210.0 lb

## 2024-02-19 DIAGNOSIS — C73 Malignant neoplasm of thyroid gland: Secondary | ICD-10-CM

## 2024-02-19 DIAGNOSIS — E890 Postprocedural hypothyroidism: Secondary | ICD-10-CM

## 2024-02-19 DIAGNOSIS — E1165 Type 2 diabetes mellitus with hyperglycemia: Secondary | ICD-10-CM | POA: Diagnosis not present

## 2024-02-19 LAB — POCT GLYCOSYLATED HEMOGLOBIN (HGB A1C): Hemoglobin A1C: 6.3 % — AB (ref 4.0–5.6)

## 2024-02-19 NOTE — Patient Instructions (Addendum)
 Please continue: - Metformin  1000 mg 2x a day with meals  Please continue levothyroxine  200 mcg daily.  Take the thyroid  hormone every day, with water, at least 30 minutes before breakfast, separated by at least 4 hours from: - acid reflux medications - calcium - iron - multivitamins  Please stop at the lab.  Please return in 4-6 months.

## 2024-02-19 NOTE — Progress Notes (Signed)
 Patient ID: Jacqueline Calderon, female   DOB: 07/20/1975, 48 y.o.   MRN: [id number]  HPI  Jacqueline Calderon is a 48 y.o.-year-old female, initially referred by Dr. 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 She previously saw Dr. Faythe and then Dr. Tommas.  Last visit 3 months ago.  Interim history: No increased urination, increased thirst, blurry vision, unintentional weight loss. She was able to lose approximately 12 pounds before last visit through changes in diet.  She lost 14 more pounds since then.  She continues to reduce sweets, to make healthy substitutions in her diet and avoid overeating.  She also tries to stay well-hydrated.  DM2: In 09/2023, she complained of urinary frequency and pressure.  Urinalysis showed 3+ glycosuria. She had further investigation few days later by Dr Mat.  This showed a high HbA1c and glucose.  High HbA1c was confirmed when she returns to the clinic few days later.  Patient gives a history of gestational diabetes in 2011.  Around 2017, she was diagnosed with prediabetes by Dr. Tommas.  She was started on metformin , which she continued since.   Reviewed HbA1c: Lab Results  Component Value Date   HGBA1C 8.3 (A) 10/04/2023  10/01/2023: HbA1c 8.5% 12/21/2015: HbA1c 5.8%  Previously had GDM in 2011.  Pt is on a regimen of: - Metformin  500 mg 1x a day at lunch - since 2017 >> increase to 1000 mg 2x a day (B and D)  She checks CBGs 1x a day: - am: 110-141 >> 98-110 - 2h after b'fast: 124 >> 150s - lunch: n/c - 2h after lunch: 140s-152 >> 150s, 170 - dinner: n/c - 2h after dinner: 140-150s, 181 >> 150s - bedtime: Lowest: 110 >> 98. Highest: 264 (grapes) >> 170  - no CKD, last BUN/creatinine:   Lab Results  Component Value Date   BUN 4 (L) 04/24/2010   BUN 5 (L) 04/22/2010   CREATININE 0.47 04/24/2010   CREATININE  0.60 04/22/2010   Lab Results  Component Value Date   MICRALBCREAT 5 11/15/2023  She is on losartan 50 mg daily.  - + Dyslipidemia; Last set of lipids:  No results found for: CHOL, HDL, LDLCALC, LDLDIRECT, TRIG, CHOLHDL  - last eye exam was in 2024 - Non- diabetic.  - no numbness and tingling in her feet.  Last foot exam 10/04/2023.  Pt has FH of DM2 in MGM.  Reviewed history: Patient describes that she was found to have thyroid  nodules during an exam in her OB/GYN office in 2009.  She was sent for a thyroid  ultrasound, and then a biopsy, which confirmed thyroid  cancer.  She had total thyroidectomy in 2009 and she mentions that she was started on a low-dose of levothyroxine  afterwards.  She started to gain a lot of weight and felt poorly.  She saw [facility] and she was switched to Armour.  The dose was gradually increased over the years.  At our first visit in 06/2022, she described palpitations and tremors and overall signs of thyrotoxicosis at times, but sometimes feeling tired and she also had weight gain.  She saw endocrinology in the past, but not in few years prior to our first appointment.  Refills were per by PCP, but she mentioned that she had pauses in her Armour course at this was not refilled unless she went in person for an appointment.  She was trying to establish care  with another PCP.    Dr. Mat recommended to establish care with me.  Reviewed thyroid  cancer history: - dx in 2009  Total thyroidectomy (01/24/2008-Dr. Gerkin)  THYROID , THYROIDECTOMY:   - PAPILLARY THYROID  CARCINOMA, 0.5 CM.   - SEE ONCOLOGY TABLE BELOW.   1. Maximum tumor size (cm): 0.5 cm   2. Tumor location: Left upper lobe   3. Multifocality: No   4. Histology: Papillary thyroid  carcinoma, follicular variant   5. Margins: Negative for carcinoma   6. Capsular invasion: N/A   7. Extrathyroidal extension: Not identified   8. Vascular/Lymphatic invasion: Not  identified   9. Lymph nodes: # examined 0; # positive 0   10. TNM code: pT1a, pNX, pMX   11. Non-neoplastic thyroid : Adenomatous nodules (2)   12. Comments: The 0.5 cm nodule in the left upper   thyroid  lobe has features of follicular variant of papillary   thyroid  carcinoma. The remaining two grossly identifiable   nodules are adenomatous nodules.   RAI treatment (06/04/2008): 104.5 mCi  Whole-body scan (06/16/2018): 1.  Residual functioning thyroid  tissue within the thyroid  bed.  2.  No evidence for distant metastatic disease.   Thyroid  ultrasound (10/21/2008): Findings: The right thyroid  lobe, left thyroid  lobe, and isthmus  are surgically absent status post total thyroidectomy.  Imaging of  the thyroid  bed demonstrates a 0.9 x 0.7 x 0.5 cm lymph node at the  expected location of the isthmus.  Right superolateral lymph node measures 2.9 x 1.4 x 0.6 cm.  Left superolateral lymph node measures 2.9 x 1.3 x 0.9 cm.  These lateral lymph nodes as measured above appear to lack central  fatty hila, with cortical thickening.    Other smaller normal appearing lymph nodes are noted bilaterally.    IMPRESSION:  Bilateral abnormal appearing lymph nodes as above; differential  considerations include neoplastic versus infectious/inflammatory  involvement.  Consider ultrasound guided biopsy/aspiration for  further evaluation.   FNA  Left LN (11/05/2008):  SMEARS AND CELL BLOCK: ABUNDANT LYMPHOCYTES. NO METASTATIC   CARCINOMA IDENTIFIED.   Whole-body scan (04/08/2009): No areas of abnormal uptake  Whole-body scan (09/02/2012): No areas of abnormal uptake  Neck ultrasound (12/21/2010): Findings: Small irregular hyperechoic area in the soft tissues just  superior to the isthmus in the midline again visualized and stable  in appearance since the prior study.  This has an appearance  suggestive of scar tissue.   Several small lymph nodes are visualized in the left and right  neck.  Left  cervical nodes are significantly less prominent  compared to the prior study with no dominant enlarged lymph node  remaining.  The largest lymph node in the right neck is stable in  size and appearance, measuring roughly 1.6 x 0.5 x 1.2 cm.   IMPRESSION:  No evidence of enlarging lymph nodes or abnormal soft tissue  nodules.  Hyperechoic area in the midline is stable.  Lymph nodes  in the left neck are less prominent.  The largest lymph node in the  right neck is stable.   Neck ultrasound (12/25/2011): Lymphadenopathy: 1 right: 1.6 x 0.4 x 1 cm 2 left: 1.6 x 0.6 x 1 cm and 1.2 x 0.5 x 0.8 cm IMPRESSION:  1. Thyroidectomy without evidence of recurrent disease.  2.  Stable bilateral neck lymph nodes.    Neck ultrasound (08/10/2020): Ultrasound performed of the right posterior neck area of concern. In this region, there are superficial small adjacent hypoechoic nodules with fatty vascular hila measuring  6 x 4 x 7 mm and 8 x 6 x 6 mm. These are small superficial benign-appearing lymph nodes.  IMPRESSION: Right posterior neck palpable abnormality correlates with small superficial benign-appearing lymph nodes.   Neck U/S (07/25/2022): Surgical changes of total thyroidectomy without evidence of residual or recurrent thyroid  tissue or lymphadenopathy.  Reviewed thyroglobulin and antithyroglobulin antibody levels: Lab Results  Component Value Date   THYROGLB <0.1 (L) 09/25/2023   THYROGLB <0.1 (L) 07/06/2022   Lab Results  Component Value Date   THGAB <1 09/25/2023   THGAB <1 07/06/2022   Pt denies: - feeling nodules in neck - hoarseness - dysphagia - choking  Postsurgical hypothyroidism:  At our first visit, she was on Armour 240 mg daily (equivalent of 400 mcg LT4 daily!). In 06/2022, we decreased the dose to 180 mg daily (equivalent of 300 mcg LT4 daily). In 09/2022, we changed to: 2 tablets of 175 mcg LT4 2 tablets of 5 mcg LT3 daily  for an equivalent to 390 mcg LT4  daily. In 01/2023, we changed to LT4 300 mcg daily In 03/2023, we decreased the dose to 250 mcg daily In 09/2023, we decreased the dose to 225 mcg daily In 10/2023, we decreased the dose to 200 mcg daily  She takes this: - in am - on the nightstand - fasting - at least 30 min from b'fast - no calcium - no iron - + multivitamins at lunchtime - no PPIs - not on Biotin - on B12  Reviewed her TFTs: Lab Results  Component Value Date   TSH 0.05 (L) 11/15/2023   TSH 0.33 (A) 10/02/2023   TSH 0.29 (L) 09/25/2023   TSH 0.01 (L) 03/28/2023   TSH <0.01 Repeated and verified X2. (L) 01/26/2023   TSH 30.96 (H) 09/25/2022   TSH 1.80 07/06/2022   FREET4 2.5 (H) 11/15/2023   FREET4 13.5 10/02/2023   FREET4 1.8 09/25/2023   FREET4 2.6 (H) 03/28/2023   FREET4 2.05 (H) 01/26/2023   FREET4 0.32 (L) 09/25/2022   FREET4 0.61 07/06/2022    She has + FH of thyroid  disorders in: maternal uncle. No FH of thyroid  cancer.  No h/o radiation tx to head or neck aside RAI tx.  She has a FH of CHF in mother and CHRISTELLA [place] and [facility].  She has a h/o kidney stones - last 2011 when pregnant.  ROS: + See HPI  Past Medical History:  Diagnosis Date   Thyroid  cancer [facility])    Past Surgical History:  Procedure Laterality Date   BREAST EXCISIONAL BIOPSY Left    CESAREAN SECTION  04/21/2010   THYROID  SURGERY     total thyroidectomy - Dr Eletha 01/24/2008   Social History   Socioeconomic History   Marital status: Married    Spouse name: Not on file   Number of children: 1   Years of education: Not on file   Highest education level: Not on file  Occupational History   Occupation: teacher, english as a foreign language  Tobacco Use   Smoking status: Former    Current packs/day: 0.00    Types: Cigarettes    Quit date: 2011    Years since quitting: 14.9   Smokeless tobacco: Not on file  Substance and Sexual Activity   Alcohol use: Yes    Comment: 2-3x a week   Drug use: Not on file   Sexual activity: Not on file   Other Topics Concern   Not on file  Social History Narrative   Not on  file   Social Drivers of Corporate Investment Banker Strain: Not on file  Food Insecurity: Not on file  Transportation Needs: Not on file  Physical Activity: Not on file  Stress: Not on file  Social Connections: Not on file  Intimate Partner Violence: Not on file   Current Outpatient Medications on File Prior to Visit  Medication Sig Dispense Refill   Accu-Chek Softclix Lancets lancets Use as instructed 1x a day 100 each 3   Blood Glucose Monitoring Suppl (ACCU-CHEK GUIDE) w/Device KIT Use as advised 1 kit 0   chlorthalidone (HYGROTON) 25 MG tablet Take 1 tablet by mouth daily.     glucose blood test strip Use as instructed 1x a day - AccuChek Guide 100 each 3   ketoconazole (NIZORAL) 2 % cream APPLY TO THE AFFECTED AREA(S) BY TOPICAL ROUTE ONCE DAILY     levothyroxine  (SYNTHROID ) 200 MCG tablet Take 1 tablet (200 mcg total) by mouth daily. 45 tablet 3   losartan (COZAAR) 50 MG tablet Take 1 tablet by mouth daily.     metFORMIN  (GLUCOPHAGE -XR) 500 MG 24 hr tablet Take 2 tablets (1,000 mg total) by mouth 2 (two) times daily with a meal. 360 tablet 3   rosuvastatin (CRESTOR) 10 MG tablet Take 1 tablet by mouth daily.     No current facility-administered medications on file prior to visit.   No Known Allergies Family History  Problem Relation Age of Onset   Congestive Heart Failure Mother    Congestive Heart Failure Maternal Grandfather   + Also, grandmother with diabetes, HTN, HL. + Grandfather with prostate cancer  PE: BP 122/84   Pulse 82   Resp 18   Ht 5' 5 (1.651 m)   Wt 210 lb (95.3 kg)   SpO2 97%   BMI 34.95 kg/m  Wt Readings from Last 3 Encounters:  02/19/24 210 lb (95.3 kg)  11/15/23 224 lb 6.4 oz (101.8 kg)  10/04/23 236 lb 6.4 oz (107.2 kg)   Constitutional: Overweight, in NAD Eyes:  EOMI, no exophthalmos ENT: no neck masses palpated, thyroid  scar inconspicuous, no cervical  lymphadenopathy Cardiovascular: RRR, No MRG Respiratory: CTA B Musculoskeletal: no deformities Skin:no rashes Neurological: no tremor with outstretched hands  ASSESSMENT: 1. DM2 non-insulin-dependent, without long-term complications, but with hyperglycemia  2. Thyroid  cancer - see HPI  3. Postsurgical Hypothyroidism  PLAN:  1.  DM2  - This was diagnosed in the months prior to our last appointment -At our visit from 09/25/2023, she complained about increased urinary frequency and pressure, without dysuria.  We checked her urinalysis which did not show a UTI, but it showed 3+ glucose.  Patient was not a known diabetic.  She had a CMP obtained in Dr. Glennette office few days later and that showed a glucose of 190 and an HbA1c of 8.5%.  We then repeated her HbA1c to confirm the diagnosis of diabetes and this again returned elevated, at 8.3%.  She does have a history of gestational diabetes and then prediabetes and she was on the low-dose metformin  for the last approximately 8 years.   - We discussed about type 2 diabetes as a disorder associated with insulin resistance, we reviewed possible complications, glycemic targets and treatment options.  She was not checking blood sugars at home and I advised her to start checking once a day, rotating check times.  We also discussed about improving diet, which she already started to do.  I advised her that she absolutely needs to avoid  any sweet drinks including juice, regular soda and sweet tea.  She declined a referral to nutrition.  We also increase metformin  dose.  At last visit, the majority of her blood sugars were at goal and she was doing a great job with her diet.  She was interested in seeing a nutritionist and I put the referral in.  She was still occasionally having more concentrated sweets, for example grapes and we discussed about looking at the glycemic index and the glycemic load of different foods but I did not recommend a change in regimen at  last visit.  She is on maximum dose of metformin , 2000 mg daily. - At today's visit, sugars are excellent, all at goal. - She does appear to have improving type 2 diabetes, so I do not feel we absolutely need to screen her for insulin deficiency and pancreatic autoimmunity right now. - we checked her HbA1c: 6.3% (greatly improved!) - advised to check sugars at different times of the day - 1x a day, rotating check times - advised for yearly eye exams >> she is not UTD - return to clinic in 4-6 months  2. Papillary thyroid  cancer -follicular variant -Patient with stage I TNM thyroid  cancer as the tumor was small, subcentimeter, unifocal, and with clear margins and also without lymphovascular invasion.  We discussed about the fact that this is a good prognosis, although the follicular variant of PTC has a slightly higher risk of vascular invasion. - She had total thyroidectomy and then RAI treatment in 2009.  She had 3 whole-body scans with the latest being in 2014, which showed no abnormal sites of uptake.  She also had several imaging tests and 1 biopsy to investigate enlarged lymph nodes (benign).  The lymph nodes were less conspicuous over time except for posterior right lymph node which appeared to be benign (with a fatty hilum) in 2022.  Latest neck ultrasound from 07/2022 did not show any signs of recurrence or abnormal neck masses. - In 07/2022, thyroglobulin levels were undetectable.  I could not find thyroglobulin level checked for her previously - Her thyroglobulin and ATA antibodies were also undetectable at last check in 09/2023 and we plan to recheck them in a year from the previous.  3.  Patient with history of total thyroidectomy for thyroid  cancer, now with uncontrolled iatrogenic hypothyroidism.  She was previously on desiccated thyroid  extract but now on LT4.  He mentioned feeling much better on LT4 compared to Armour.  We have been decreasing her LT4 doses, since she has been on very  high doses in the past.  In the last year we decreased her dose from 300 to 250 to 225 and at last visit we decreased this further to 200 mcg daily after the below results returned: Lab Results  Component Value Date   TSH 0.05 (L) 11/15/2023  - she continues on LT4 200 mcg daily, but did not return for labs after the above dose change. - pt feels good on this dose.  She did lose 14 pounds since last visit but likely due to changes in diet. - we discussed about taking the thyroid  hormone every day, with water, >30 minutes before breakfast, separated by >4 hours from acid reflux medications, calcium, iron, multivitamins. Pt. is taking it correctly. - will check thyroid  tests today: TSH and fT4 - If labs are abnormal, she will need to return for repeat TFTs in 1.5 months  Needs refills for LT4 -90 days.  Orders Placed This Encounter  Procedures   TSH   T4, free   POCT glycosylated hemoglobin (Hb A1C)   Lela Fendt, MD PhD [facility]

## 2024-02-20 ENCOUNTER — Ambulatory Visit: Payer: Self-pay | Admitting: Internal Medicine

## 2024-02-20 LAB — TSH: TSH: 0.28 m[IU]/L — ABNORMAL LOW

## 2024-02-20 LAB — T4, FREE: Free T4: 1.8 ng/dL (ref 0.8–1.8)

## 2024-02-20 MED ORDER — LEVOTHYROXINE SODIUM 175 MCG PO TABS: 175.0000 ug | ORAL_TABLET | Freq: Every day | ORAL | 5 refills | Status: AC

## 2024-02-20 NOTE — Addendum Note (Signed)
 Addended by: TRIXIE FILE on: 02/20/2024 12:19 PM   Modules accepted: Orders

## 2024-03-06 NOTE — Progress Notes (Unsigned)
 Chief Complaint: Primary GI MD: Sampson  HPI:  *** is a  ***  who was referred to me by Mat Browning, MD for a complaint of *** .   Referred by PCP 09/2023 for elevated LFTs AST 73/ALT 70/alk phos 103, normal bilirubin, normal platelets   Discussed the use of AI scribe software for clinical note transcription with the patient, who gave verbal consent to proceed.  History of Present Illness      PREVIOUS GI WORKUP     Past Medical History:  Diagnosis Date   Thyroid  cancer [facility])     Past Surgical History:  Procedure Laterality Date   BREAST EXCISIONAL BIOPSY Left    CESAREAN SECTION  04/21/2010   THYROID  SURGERY     total thyroidectomy - Dr Eletha 01/24/2008    Current Outpatient Medications  Medication Sig Dispense Refill   Accu-Chek Softclix Lancets lancets Use as instructed 1x a day 100 each 3   Blood Glucose Monitoring Suppl (ACCU-CHEK GUIDE) w/Device KIT Use as advised 1 kit 0   chlorthalidone (HYGROTON) 25 MG tablet Take 1 tablet by mouth daily.     glucose blood test strip Use as instructed 1x a day - AccuChek Guide 100 each 3   levothyroxine  (SYNTHROID ) 175 MCG tablet Take 1 tablet (175 mcg total) by mouth daily. 45 tablet 5   losartan (COZAAR) 50 MG tablet Take 1 tablet by mouth daily.     metFORMIN  (GLUCOPHAGE -XR) 500 MG 24 hr tablet Take 2 tablets (1,000 mg total) by mouth 2 (two) times daily with a meal. 360 tablet 3   rosuvastatin (CRESTOR) 10 MG tablet Take 1 tablet by mouth daily.     No current facility-administered medications for this visit.    Allergies as of 03/07/2024   (No Known Allergies)    Family History  Problem Relation Age of Onset   Congestive Heart Failure Mother    Congestive Heart Failure Maternal Grandfather     Social History   Socioeconomic History   Marital status: Married    Spouse name: Not on file   Number of children: 1   Years of education: Not on file   Highest education level: Not on file  Occupational  History   Occupation: teacher, english as a foreign language  Tobacco Use   Smoking status: Former    Current packs/day: 0.00    Types: Cigarettes    Quit date: 2011    Years since quitting: 14.9   Smokeless tobacco: Not on file  Substance and Sexual Activity   Alcohol use: Yes    Comment: 2-3x a week   Drug use: Not on file   Sexual activity: Not on file  Other Topics Concern   Not on file  Social History Narrative   Not on file   Social Drivers of Health   Tobacco Use: Medium Risk (02/19/2024)   Patient History    Smoking Tobacco Use: Former    Smokeless Tobacco Use: Unknown    Passive Exposure: Not on Actuary Strain: Not on file  Food Insecurity: Not on file  Transportation Needs: Not on file  Physical Activity: Not on file  Stress: Not on file  Social Connections: Not on file  Intimate Partner Violence: Not on file  Depression (EYV7-0): Not on file  Alcohol Screen: Not on file  Housing: Not on file  Utilities: Not on file  Health Literacy: Not on file    Review of Systems:    Constitutional: No weight loss, fever,  chills, weakness or fatigue HEENT: Eyes: No change in vision               Ears, Nose, Throat:  No change in hearing or congestion Skin: No rash or itching Cardiovascular: No chest pain, chest pressure or palpitations   Respiratory: No SOB or cough Gastrointestinal: See HPI and otherwise negative Genitourinary: No dysuria or change in urinary frequency Neurological: No headache, dizziness or syncope Musculoskeletal: No new muscle or joint pain Hematologic: No bleeding or bruising Psychiatric: No history of depression or anxiety    Physical Exam:  Vital signs: There were no vitals taken for this visit.  Constitutional: NAD, alert and cooperative Head:  Normocephalic and atraumatic. Eyes:   PEERL, EOMI. No icterus. Conjunctiva pink. Respiratory: Respirations even and unlabored. Lungs clear to auscultation bilaterally.   No wheezes, crackles, or  rhonchi.  Cardiovascular:  Regular rate and rhythm. No peripheral edema, cyanosis or pallor.  Gastrointestinal:  Soft, nondistended, nontender. No rebound or guarding. Normal bowel sounds. No appreciable masses or hepatomegaly. Rectal:  Declines Msk:  Symmetrical without gross deformities. Without edema, no deformity or joint abnormality.  Neurologic:  Alert and  oriented x4;  grossly normal neurologically.  Skin:   Dry and intact without significant lesions or rashes. Psychiatric: Oriented to person, place and time. Demonstrates good judgement and reason without abnormal affect or behaviors.  Physical Exam    RELEVANT LABS AND IMAGING: CBC    Component Value Date/Time   WBC 8.2 04/24/2010 0526   RBC 2.98 (L) 04/24/2010 0526   HGB 7.5 (L) 04/24/2010 0526   HCT 24.1 (L) 04/24/2010 0526   PLT 154 04/24/2010 0526   MCV 80.9 04/24/2010 0526   MCH 25.2 (L) 04/24/2010 0526   MCHC 31.1 04/24/2010 0526   RDW 14.8 04/24/2010 0526   LYMPHSABS 1.4 01/24/2008 0915   MONOABS 0.6 01/24/2008 0915   EOSABS 0.3 01/24/2008 0915   BASOSABS 0.0 01/24/2008 0915    CMP     Component Value Date/Time   NA 141 04/24/2010 0526   K 2.9 (L) 04/24/2010 0526   CL 108 04/24/2010 0526   CO2 26 04/24/2010 0526   GLUCOSE 87 04/24/2010 0526   BUN 4 (L) 04/24/2010 0526   CREATININE 0.47 04/24/2010 0526   CALCIUM 8.0 (L) 04/24/2010 0526   PROT 4.8 (L) 04/24/2010 0526   ALBUMIN 2.2 (L) 04/24/2010 0526   AST 25 04/24/2010 0526   ALT 32 04/24/2010 0526   ALKPHOS 104 04/24/2010 0526   BILITOT 0.4 04/24/2010 0526   GFRNONAA >60 04/24/2010 0526   GFRAA  04/24/2010 0526    >60        The eGFR has been calculated using the MDRD equation. This calculation has not been validated in all clinical situations. eGFR's persistently <60 mL/min signify possible Chronic Kidney Disease.     Assessment/Plan:   Elevated LFTs Labs 09/2023 AST 73/ALT 70/alk phos 103, normal bilirubin, normal platelets.  No  imaging.  Colon cancer screening Negative Cologuard 06/2022  Type 2 diabetes  Follicular variant papillary thyroid  cancer Postsurgical hypothyroidism -Follows with endocrinology   Sharlyne Koeneman Mollie RIGGERS [facility] 03/06/2024, 12:05 PM  Cc: Mat Browning, MD

## 2024-03-07 ENCOUNTER — Ambulatory Visit: Admitting: Gastroenterology

## 2024-03-07 ENCOUNTER — Encounter: Payer: Self-pay | Admitting: Gastroenterology

## 2024-03-07 ENCOUNTER — Other Ambulatory Visit

## 2024-03-07 VITALS — BP 106/70 | HR 82 | Ht 65.0 in | Wt 208.0 lb

## 2024-03-07 DIAGNOSIS — Z7984 Long term (current) use of oral hypoglycemic drugs: Secondary | ICD-10-CM

## 2024-03-07 DIAGNOSIS — R1012 Left upper quadrant pain: Secondary | ICD-10-CM

## 2024-03-07 DIAGNOSIS — R7989 Other specified abnormal findings of blood chemistry: Secondary | ICD-10-CM

## 2024-03-07 DIAGNOSIS — E890 Postprocedural hypothyroidism: Secondary | ICD-10-CM

## 2024-03-07 DIAGNOSIS — E119 Type 2 diabetes mellitus without complications: Secondary | ICD-10-CM | POA: Diagnosis not present

## 2024-03-07 DIAGNOSIS — C73 Malignant neoplasm of thyroid gland: Secondary | ICD-10-CM

## 2024-03-07 LAB — PROTIME-INR
INR: 1.1 ratio — ABNORMAL HIGH (ref 0.8–1.0)
Prothrombin Time: 11.7 s (ref 9.6–13.1)

## 2024-03-07 LAB — COMPREHENSIVE METABOLIC PANEL WITH GFR
ALT: 26 U/L (ref 3–35)
AST: 26 U/L (ref 5–37)
Albumin: 4.7 g/dL (ref 3.5–5.2)
Alkaline Phosphatase: 65 U/L (ref 39–117)
BUN: 11 mg/dL (ref 6–23)
CO2: 29 meq/L (ref 19–32)
Calcium: 9.6 mg/dL (ref 8.4–10.5)
Chloride: 99 meq/L (ref 96–112)
Creatinine, Ser: 0.57 mg/dL (ref 0.40–1.20)
GFR: 108.36 mL/min
Glucose, Bld: 141 mg/dL — ABNORMAL HIGH (ref 70–99)
Potassium: 3.2 meq/L — ABNORMAL LOW (ref 3.5–5.1)
Sodium: 138 meq/L (ref 135–145)
Total Bilirubin: 0.7 mg/dL (ref 0.2–1.2)
Total Protein: 8.3 g/dL (ref 6.0–8.3)

## 2024-03-07 LAB — CBC WITH DIFFERENTIAL/PLATELET
Basophils Absolute: 0 K/uL (ref 0.0–0.1)
Basophils Relative: 0.7 % (ref 0.0–3.0)
Eosinophils Absolute: 0.2 K/uL (ref 0.0–0.7)
Eosinophils Relative: 2.9 % (ref 0.0–5.0)
HCT: 37.7 % (ref 36.0–46.0)
Hemoglobin: 12.5 g/dL (ref 12.0–15.0)
Lymphocytes Relative: 17.4 % (ref 12.0–46.0)
Lymphs Abs: 1.1 K/uL (ref 0.7–4.0)
MCHC: 33.1 g/dL (ref 30.0–36.0)
MCV: 72.2 fl — ABNORMAL LOW (ref 78.0–100.0)
Monocytes Absolute: 0.4 K/uL (ref 0.1–1.0)
Monocytes Relative: 6.1 % (ref 3.0–12.0)
Neutro Abs: 4.7 K/uL (ref 1.4–7.7)
Neutrophils Relative %: 72.9 % (ref 43.0–77.0)
Platelets: 400 K/uL (ref 150.0–400.0)
RBC: 5.22 Mil/uL — ABNORMAL HIGH (ref 3.87–5.11)
RDW: 16 % — ABNORMAL HIGH (ref 11.5–15.5)
WBC: 6.4 K/uL (ref 4.0–10.5)

## 2024-03-07 LAB — IBC + FERRITIN
Ferritin: 7.9 ng/mL — ABNORMAL LOW (ref 10.0–291.0)
Iron: 48 ug/dL (ref 42–145)
Saturation Ratios: 9 % — ABNORMAL LOW (ref 20.0–50.0)
TIBC: 534.8 ug/dL — ABNORMAL HIGH (ref 250.0–450.0)
Transferrin: 382 mg/dL — ABNORMAL HIGH (ref 212.0–360.0)

## 2024-03-07 NOTE — Patient Instructions (Addendum)
 Your provider has requested that you go to the basement level for lab work before leaving today. Press B on the elevator. The lab is located at the first door on the left as you exit the elevator.  Due to recent changes in healthcare laws, you may see the results of your imaging and laboratory studies on MyChart before your provider has had a chance to review them.  We understand that in some cases there may be results that are confusing or concerning to you. Not all laboratory results come back in the same time frame and the provider may be waiting for multiple results in order to interpret others.  Please give us  48 hours in order for your provider to thoroughly review all the results before contacting the office for clarification of your results.    You have been scheduled for an abdominal ultrasound at [facility] (1st floor of hospital) on Monday, 03/10/24 at 7:00 am. Please arrive 15 minutes prior to your appointment for registration. Make certain not to have anything to eat or drink after midnight prior to your appointment. Should you need to reschedule your appointment, please contact radiology at [phone]. This test typically takes about 30 minutes to perform.   Thank you for trusting me with your gastrointestinal care!   Nestor Blower, PA   _______________________________________________________  If your blood pressure at your visit was 140/90 or greater, please contact your primary care physician to follow up on this.  _______________________________________________________  If you are age 17 or older, your body mass index should be between 23-30. Your Body mass index is 34.61 kg/m. If this is out of the aforementioned range listed, please consider follow up with your Primary Care Provider.  If you are age 68 or younger, your body mass index should be between 19-25. Your Body mass index is 34.61 kg/m. If this is out of the aformentioned range listed, please  consider follow up with your Primary Care Provider.   ________________________________________________________  The [facility] providers would like to encourage you to use MYCHART to communicate with providers for non-urgent requests or questions.  Due to long hold times on the telephone, sending your provider a message by [facility] may be a faster and more efficient way to get a response.  Please allow 48 business hours for a response.  Please remember that this is for non-urgent requests.  _______________________________________________________  [facility] is using a team-based approach to care.  Your team is made up of your doctor and two to three APPS. Our APPS (Nurse Practitioners and Physician Assistants) work with your physician to ensure care continuity for you. They are fully qualified to address your health concerns and develop a treatment plan. They communicate directly with your gastroenterologist to care for you. Seeing the Advanced Practice Practitioners on your physician's team can help you by facilitating care more promptly, often allowing for earlier appointments, access to diagnostic testing, procedures, and other specialty referrals.

## 2024-03-10 ENCOUNTER — Ambulatory Visit (HOSPITAL_COMMUNITY)
Admission: RE | Admit: 2024-03-10 | Discharge: 2024-03-10 | Disposition: A | Discharge status: Home / Self Care | Source: Ambulatory Visit | Attending: Gastroenterology | Admitting: Gastroenterology

## 2024-03-10 DIAGNOSIS — R7989 Other specified abnormal findings of blood chemistry: Secondary | ICD-10-CM | POA: Insufficient documentation

## 2024-03-10 LAB — MITOCHONDRIAL ANTIBODIES: Mitochondrial M2 Ab, IgG: <=20 U

## 2024-03-10 LAB — HEPATITIS B SURFACE ANTIBODY,QUALITATIVE: Hep B S Ab: REACTIVE — AB

## 2024-03-10 LAB — ANA: Anti Nuclear Antibody (ANA): NEGATIVE

## 2024-03-10 LAB — CERULOPLASMIN: Ceruloplasmin: 30 mg/dL (ref 14–48)

## 2024-03-10 LAB — ALPHA-1-ANTITRYPSIN: A-1 Antitrypsin, Ser: 161 mg/dL (ref 83–199)

## 2024-03-10 LAB — ANTI-SMOOTH MUSCLE ANTIBODY, IGG: Actin (Smooth Muscle) Antibody (IGG): <20 U

## 2024-03-10 LAB — HEPATITIS B SURFACE ANTIGEN: Hepatitis B Surface Ag: NONREACTIVE

## 2024-03-10 LAB — HEPATITIS A ANTIBODY, TOTAL: Hepatitis A AB,Total: NONREACTIVE

## 2024-03-10 LAB — HEPATITIS C ANTIBODY: Hepatitis C Ab: NONREACTIVE

## 2024-03-11 ENCOUNTER — Ambulatory Visit: Payer: Self-pay | Admitting: Gastroenterology

## 2024-03-11 NOTE — Progress Notes (Signed)
 Agree with the assessment and plan as outlined by Boone Master, PA-C.

## 2024-03-27 ENCOUNTER — Ambulatory Visit: Admitting: Internal Medicine

## 2024-05-02 ENCOUNTER — Ambulatory Visit: Admitting: Gastroenterology

## 2024-07-22 ENCOUNTER — Ambulatory Visit: Admitting: Internal Medicine
